# Patient Record
Sex: Male | Born: 1960 | Race: White | Hispanic: No | Marital: Married | Smoking: Never smoker
Health system: Southern US, Community
[De-identification: ages and names within clinical notes are randomized; demographics above are authoritative.]

## PROBLEM LIST (undated history)

## (undated) DIAGNOSIS — F1010 Alcohol abuse, uncomplicated: Secondary | ICD-10-CM

## (undated) DIAGNOSIS — I10 Essential (primary) hypertension: Secondary | ICD-10-CM

## (undated) DIAGNOSIS — S3992XA Unspecified injury of lower back, initial encounter: Secondary | ICD-10-CM

## (undated) DIAGNOSIS — R519 Headache, unspecified: Secondary | ICD-10-CM

## (undated) DIAGNOSIS — E785 Hyperlipidemia, unspecified: Secondary | ICD-10-CM

## (undated) DIAGNOSIS — R972 Elevated prostate specific antigen [PSA]: Secondary | ICD-10-CM

## (undated) DIAGNOSIS — W19XXXA Unspecified fall, initial encounter: Secondary | ICD-10-CM

## (undated) HISTORY — PX: TONSILLECTOMY: SUR1361

## (undated) HISTORY — DX: Hyperlipidemia, unspecified: E78.5

## (undated) HISTORY — DX: Essential (primary) hypertension: I10

## (undated) HISTORY — DX: Unspecified fall, initial encounter: W19.XXXA

## (undated) HISTORY — DX: Unspecified injury of lower back, initial encounter: S39.92XA

## (undated) HISTORY — DX: Headache, unspecified: R51.9

## (undated) HISTORY — DX: Alcohol abuse, uncomplicated: F10.10

## (undated) HISTORY — DX: Elevated prostate specific antigen (PSA): R97.20

---

## 2006-12-21 ENCOUNTER — Ambulatory Visit: Payer: Self-pay

## 2010-11-15 ENCOUNTER — Emergency Department: Payer: Self-pay | Admitting: Unknown Physician Specialty

## 2011-09-17 LAB — HM COLONOSCOPY

## 2014-11-10 ENCOUNTER — Emergency Department: Payer: Self-pay | Admitting: Internal Medicine

## 2015-02-20 ENCOUNTER — Other Ambulatory Visit: Payer: Self-pay | Admitting: Neurology

## 2015-02-20 DIAGNOSIS — M542 Cervicalgia: Secondary | ICD-10-CM

## 2015-02-28 ENCOUNTER — Ambulatory Visit
Admission: RE | Admit: 2015-02-28 | Discharge: 2015-02-28 | Disposition: A | Discharge status: Home / Self Care | Payer: 59 | Source: Ambulatory Visit | Attending: Neurology | Admitting: Neurology

## 2015-02-28 DIAGNOSIS — M542 Cervicalgia: Secondary | ICD-10-CM

## 2015-02-28 DIAGNOSIS — M4802 Spinal stenosis, cervical region: Secondary | ICD-10-CM | POA: Diagnosis not present

## 2015-04-18 ENCOUNTER — Other Ambulatory Visit: Payer: Self-pay | Admitting: Physical Medicine and Rehabilitation

## 2015-04-18 DIAGNOSIS — M5416 Radiculopathy, lumbar region: Secondary | ICD-10-CM

## 2015-04-26 ENCOUNTER — Ambulatory Visit
Admission: RE | Admit: 2015-04-26 | Discharge: 2015-04-26 | Disposition: A | Discharge status: Home / Self Care | Payer: 59 | Source: Ambulatory Visit | Attending: Physical Medicine and Rehabilitation | Admitting: Physical Medicine and Rehabilitation

## 2015-04-26 DIAGNOSIS — M5137 Other intervertebral disc degeneration, lumbosacral region: Secondary | ICD-10-CM | POA: Diagnosis not present

## 2015-04-26 DIAGNOSIS — M5416 Radiculopathy, lumbar region: Secondary | ICD-10-CM | POA: Diagnosis present

## 2015-07-31 ENCOUNTER — Other Ambulatory Visit: Payer: Self-pay | Admitting: Family Medicine

## 2015-08-01 NOTE — Telephone Encounter (Signed)
Will you please call for appointment so we can refill meds

## 2015-08-01 NOTE — Telephone Encounter (Signed)
Overdue for phyiscal in June with Dr. Dossie Arbourrissman. Needs an appointment for that. I'm sure Dr. Dossie Arbourrissman will get him enough medicine to make it to appointment when it's booked.

## 2015-08-05 NOTE — Telephone Encounter (Signed)
08/15/15 @ 10 °

## 2015-08-15 ENCOUNTER — Ambulatory Visit (INDEPENDENT_AMBULATORY_CARE_PROVIDER_SITE_OTHER): Payer: 59 | Admitting: Family Medicine

## 2015-08-15 ENCOUNTER — Encounter: Payer: Self-pay | Admitting: Family Medicine

## 2015-08-15 VITALS — BP 124/82 | HR 75 | Temp 97.9°F | Ht 73.4 in | Wt 229.2 lb

## 2015-08-15 DIAGNOSIS — Z0000 Encounter for general adult medical examination without abnormal findings: Secondary | ICD-10-CM | POA: Diagnosis not present

## 2015-08-15 DIAGNOSIS — Z23 Encounter for immunization: Secondary | ICD-10-CM | POA: Diagnosis not present

## 2015-08-15 DIAGNOSIS — E1169 Type 2 diabetes mellitus with other specified complication: Secondary | ICD-10-CM | POA: Insufficient documentation

## 2015-08-15 DIAGNOSIS — Z8639 Personal history of other endocrine, nutritional and metabolic disease: Secondary | ICD-10-CM | POA: Diagnosis not present

## 2015-08-15 DIAGNOSIS — I10 Essential (primary) hypertension: Secondary | ICD-10-CM | POA: Diagnosis not present

## 2015-08-15 LAB — URINALYSIS, ROUTINE W REFLEX MICROSCOPIC
Bilirubin, UA: NEGATIVE
Glucose, UA: NEGATIVE
Ketones, UA: NEGATIVE
Leukocytes, UA: NEGATIVE
NITRITE UA: NEGATIVE
PH UA: 7 (ref 5.0–7.5)
Protein, UA: NEGATIVE
RBC, UA: NEGATIVE
Specific Gravity, UA: 1.01 (ref 1.005–1.030)
UUROB: 0.2 mg/dL (ref 0.2–1.0)

## 2015-08-15 LAB — BAYER DCA HB A1C WAIVED: HB A1C: 6.5 % (ref <7.0)

## 2015-08-15 MED ORDER — BENAZEPRIL HCL 40 MG PO TABS
40.0000 mg | ORAL_TABLET | Freq: Every day | ORAL | Status: DC
Start: 2015-08-15 — End: 2016-09-18

## 2015-08-15 NOTE — Assessment & Plan Note (Signed)
We'll check hemoglobin A1c
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
## 2015-08-16 LAB — CBC WITH DIFFERENTIAL/PLATELET
BASOS: 0 %
Basophils Absolute: 0 10*3/uL (ref 0.0–0.2)
EOS (ABSOLUTE): 0.2 10*3/uL (ref 0.0–0.4)
EOS: 3 %
HEMATOCRIT: 45.8 % (ref 37.5–51.0)
Hemoglobin: 15.4 g/dL (ref 12.6–17.7)
IMMATURE GRANS (ABS): 0 10*3/uL (ref 0.0–0.1)
IMMATURE GRANULOCYTES: 0 %
LYMPHS: 28 %
Lymphocytes Absolute: 2.1 10*3/uL (ref 0.7–3.1)
MCH: 29.9 pg (ref 26.6–33.0)
MCHC: 33.6 g/dL (ref 31.5–35.7)
MCV: 89 fL (ref 79–97)
MONOCYTES: 8 %
Monocytes Absolute: 0.6 10*3/uL (ref 0.1–0.9)
NEUTROS ABS: 4.5 10*3/uL (ref 1.4–7.0)
NEUTROS PCT: 61 %
PLATELETS: 238 10*3/uL (ref 150–379)
RBC: 5.15 x10E6/uL (ref 4.14–5.80)
RDW: 14 % (ref 12.3–15.4)
WBC: 7.5 10*3/uL (ref 3.4–10.8)

## 2015-08-16 LAB — COMPREHENSIVE METABOLIC PANEL
A/G RATIO: 1.9 (ref 1.1–2.5)
ALBUMIN: 4.5 g/dL (ref 3.5–5.5)
ALT: 20 IU/L (ref 0–44)
AST: 16 IU/L (ref 0–40)
Alkaline Phosphatase: 98 IU/L (ref 39–117)
BILIRUBIN TOTAL: 0.3 mg/dL (ref 0.0–1.2)
BUN / CREAT RATIO: 20 (ref 9–20)
BUN: 20 mg/dL (ref 6–24)
CHLORIDE: 99 mmol/L (ref 97–106)
CO2: 24 mmol/L (ref 18–29)
Calcium: 9.7 mg/dL (ref 8.7–10.2)
Creatinine, Ser: 1.02 mg/dL (ref 0.76–1.27)
GFR calc non Af Amer: 82 mL/min/{1.73_m2} (ref >59)
GFR, EST AFRICAN AMERICAN: 95 mL/min/{1.73_m2} (ref >59)
Globulin, Total: 2.4 g/dL (ref 1.5–4.5)
Glucose: 115 mg/dL — ABNORMAL HIGH (ref 65–99)
Potassium: 4.6 mmol/L (ref 3.5–5.2)
Sodium: 139 mmol/L (ref 136–144)
TOTAL PROTEIN: 6.9 g/dL (ref 6.0–8.5)

## 2015-08-16 LAB — LIPID PANEL
Chol/HDL Ratio: 4.2 ratio units (ref 0.0–5.0)
Cholesterol, Total: 190 mg/dL (ref 100–199)
HDL: 45 mg/dL (ref >39)
LDL Calculated: 127 mg/dL — ABNORMAL HIGH (ref 0–99)
Triglycerides: 91 mg/dL (ref 0–149)
VLDL Cholesterol Cal: 18 mg/dL (ref 5–40)

## 2015-08-16 LAB — TSH: TSH: 2.5 u[IU]/mL (ref 0.450–4.500)

## 2015-08-16 LAB — PSA: Prostate Specific Ag, Serum: 1 ng/mL (ref 0.0–4.0)

## 2015-08-19 ENCOUNTER — Encounter: Payer: Self-pay | Admitting: Family Medicine

## 2015-09-24 ENCOUNTER — Telehealth: Payer: Self-pay | Admitting: Family Medicine
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
## 2015-10-08 ENCOUNTER — Encounter: Payer: Self-pay | Admitting: Family Medicine

## 2015-10-08 ENCOUNTER — Ambulatory Visit (INDEPENDENT_AMBULATORY_CARE_PROVIDER_SITE_OTHER): Payer: 59 | Admitting: Family Medicine

## 2015-10-08 VITALS — BP 133/77 | HR 91 | Temp 98.0°F | Ht 74.2 in | Wt 229.0 lb

## 2015-10-08 DIAGNOSIS — G8929 Other chronic pain: Secondary | ICD-10-CM | POA: Diagnosis not present

## 2015-10-08 DIAGNOSIS — M25511 Pain in right shoulder: Secondary | ICD-10-CM | POA: Diagnosis not present

## 2015-10-08 DIAGNOSIS — M25512 Pain in left shoulder: Secondary | ICD-10-CM

## 2015-10-08 DIAGNOSIS — M79603 Pain in arm, unspecified: Secondary | ICD-10-CM | POA: Insufficient documentation

## 2015-10-08 DIAGNOSIS — M5030 Other cervical disc degeneration, unspecified cervical region: Secondary | ICD-10-CM | POA: Diagnosis not present
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
## 2015-12-04 ENCOUNTER — Ambulatory Visit: Payer: 59 | Attending: Neurology | Admitting: Physical Therapy

## 2015-12-04 DIAGNOSIS — M542 Cervicalgia: Secondary | ICD-10-CM | POA: Diagnosis not present

## 2015-12-04 DIAGNOSIS — M2560 Stiffness of unspecified joint, not elsewhere classified: Secondary | ICD-10-CM | POA: Insufficient documentation

## 2015-12-05 ENCOUNTER — Encounter: Payer: Self-pay | Admitting: Physical Therapy
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
## 2015-12-09 NOTE — Addendum Note (Signed)
Addended by: Cammie Mcgee on: 12/09/2015 03:11 PM   Modules accepted: Orders

## 2015-12-26 ENCOUNTER — Encounter: Payer: Self-pay | Admitting: Family Medicine

## 2015-12-26 ENCOUNTER — Ambulatory Visit (INDEPENDENT_AMBULATORY_CARE_PROVIDER_SITE_OTHER): Payer: 59 | Admitting: Family Medicine

## 2015-12-26 VITALS — BP 138/86 | HR 86 | Temp 98.7°F | Ht 73.5 in | Wt 228.0 lb

## 2015-12-26 DIAGNOSIS — M5030 Other cervical disc degeneration, unspecified cervical region: Secondary | ICD-10-CM | POA: Diagnosis not present

## 2015-12-26 DIAGNOSIS — I10 Essential (primary) hypertension: Secondary | ICD-10-CM

## 2015-12-30 NOTE — Assessment & Plan Note (Signed)
The current medical regimen is effective;  continue present plan and medications.  

## 2015-12-30 NOTE — Assessment & Plan Note (Signed)
Ongoing chronic pain with limitations
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
## 2016-02-17 ENCOUNTER — Ambulatory Visit: Payer: 59 | Admitting: Family Medicine

## 2016-09-18 ENCOUNTER — Other Ambulatory Visit: Payer: Self-pay | Admitting: Family Medicine

## 2016-09-18 DIAGNOSIS — I10 Essential (primary) hypertension: Secondary | ICD-10-CM

## 2016-10-13 ENCOUNTER — Encounter: Payer: Self-pay | Admitting: Emergency Medicine

## 2016-10-13 ENCOUNTER — Ambulatory Visit (INDEPENDENT_AMBULATORY_CARE_PROVIDER_SITE_OTHER): Payer: 59

## 2016-10-13 ENCOUNTER — Ambulatory Visit
Admission: EM | Admit: 2016-10-13 | Discharge: 2016-10-13 | Disposition: A | Discharge status: Home / Self Care | Payer: 59 | Attending: Internal Medicine | Admitting: Internal Medicine

## 2016-10-13 DIAGNOSIS — T148XXA Other injury of unspecified body region, initial encounter: Secondary | ICD-10-CM

## 2016-10-13 DIAGNOSIS — W540XXA Bitten by dog, initial encounter: Secondary | ICD-10-CM

## 2016-10-13 DIAGNOSIS — S63501A Unspecified sprain of right wrist, initial encounter: Secondary | ICD-10-CM

## 2016-10-13 DIAGNOSIS — S63502A Unspecified sprain of left wrist, initial encounter: Secondary | ICD-10-CM | POA: Diagnosis not present

## 2016-10-13 MED ORDER — AMOXICILLIN-POT CLAVULANATE 875-125 MG PO TABS: 1.0000 | ORAL_TABLET | Freq: Two times a day (BID) | ORAL | 0 refills | Status: DC

## 2016-10-13 MED ORDER — MUPIROCIN 2 % EX OINT: 1.0000 "application " | TOPICAL_OINTMENT | Freq: Three times a day (TID) | CUTANEOUS | 0 refills | Status: DC
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
## 2016-10-13 NOTE — ED Triage Notes (Signed)
Patient states he fell while breaking up a fight between his dog and cat and injured his right wrist.

## 2017-11-10 ENCOUNTER — Encounter: Payer: Self-pay | Admitting: Family Medicine

## 2017-11-10 ENCOUNTER — Ambulatory Visit (INDEPENDENT_AMBULATORY_CARE_PROVIDER_SITE_OTHER): Payer: 59 | Admitting: Family Medicine

## 2017-11-10 VITALS — BP 138/84 | HR 73 | Temp 98.1°F | Ht 73.0 in | Wt 224.6 lb

## 2017-11-10 DIAGNOSIS — Z0000 Encounter for general adult medical examination without abnormal findings: Secondary | ICD-10-CM | POA: Diagnosis not present

## 2017-11-10 DIAGNOSIS — F322 Major depressive disorder, single episode, severe without psychotic features: Secondary | ICD-10-CM | POA: Insufficient documentation

## 2017-11-10 DIAGNOSIS — Z1329 Encounter for screening for other suspected endocrine disorder: Secondary | ICD-10-CM

## 2017-11-10 DIAGNOSIS — I10 Essential (primary) hypertension: Secondary | ICD-10-CM | POA: Diagnosis not present

## 2017-11-10 DIAGNOSIS — Z8639 Personal history of other endocrine, nutritional and metabolic disease: Secondary | ICD-10-CM

## 2017-11-10 DIAGNOSIS — Z125 Encounter for screening for malignant neoplasm of prostate: Secondary | ICD-10-CM

## 2017-11-10 LAB — URINALYSIS, ROUTINE W REFLEX MICROSCOPIC
BILIRUBIN UA: NEGATIVE
GLUCOSE, UA: NEGATIVE
Ketones, UA: NEGATIVE
Leukocytes, UA: NEGATIVE
Nitrite, UA: NEGATIVE
PH UA: 6.5 (ref 5.0–7.5)
PROTEIN UA: NEGATIVE
RBC UA: NEGATIVE
SPEC GRAV UA: 1.01 (ref 1.005–1.030)
Urobilinogen, Ur: 0.2 mg/dL (ref 0.2–1.0)

## 2017-11-10 LAB — BAYER DCA HB A1C WAIVED: HB A1C: 6.4 % (ref <7.0)

## 2017-11-10 MED ORDER — DULOXETINE HCL 60 MG PO CPEP: 60.0000 mg | ORAL_CAPSULE | Freq: Every day | ORAL | 1 refills | Status: DC

## 2017-11-10 MED ORDER — DULOXETINE HCL 30 MG PO CPEP: 30.0000 mg | ORAL_CAPSULE | Freq: Every day | ORAL | 0 refills | Status: DC

## 2017-11-10 MED ORDER — BENAZEPRIL HCL 40 MG PO TABS: 40.0000 mg | ORAL_TABLET | Freq: Every day | ORAL | 4 refills | Status: DC

## 2017-11-10 NOTE — Assessment & Plan Note (Signed)
A1c still borderline 6.4

## 2017-11-10 NOTE — Assessment & Plan Note (Signed)
The current medical regimen is effective;  continue present plan and medications.  

## 2017-11-10 NOTE — Assessment & Plan Note (Signed)
Discussed depression care and treatment aggravation of pain also in secondary to chronic stress will begin Cymbalta 30 mg 1 a day for a week then increase to Cymbalta 60 mg a day.  Recheck in 2-3 weeks
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
## 2017-11-11 LAB — CBC WITH DIFFERENTIAL/PLATELET
BASOS ABS: 0 10*3/uL (ref 0.0–0.2)
BASOS: 0 %
EOS (ABSOLUTE): 0 10*3/uL (ref 0.0–0.4)
EOS: 1 %
HEMATOCRIT: 42 % (ref 37.5–51.0)
HEMOGLOBIN: 14.6 g/dL (ref 13.0–17.7)
Immature Grans (Abs): 0 10*3/uL (ref 0.0–0.1)
Immature Granulocytes: 0 %
LYMPHS ABS: 2.2 10*3/uL (ref 0.7–3.1)
LYMPHS: 24 %
MCH: 30.5 pg (ref 26.6–33.0)
MCHC: 34.8 g/dL (ref 31.5–35.7)
MCV: 88 fL (ref 79–97)
MONOCYTES: 6 %
Monocytes Absolute: 0.5 10*3/uL (ref 0.1–0.9)
NEUTROS ABS: 6.1 10*3/uL (ref 1.4–7.0)
Neutrophils: 69 %
Platelets: 275 10*3/uL (ref 150–379)
RBC: 4.79 x10E6/uL (ref 4.14–5.80)
RDW: 14.1 % (ref 12.3–15.4)
WBC: 8.9 10*3/uL (ref 3.4–10.8)

## 2017-11-11 LAB — LIPID PANEL
CHOLESTEROL TOTAL: 192 mg/dL (ref 100–199)
Chol/HDL Ratio: 4.1 ratio (ref 0.0–5.0)
HDL: 47 mg/dL (ref >39)
LDL Calculated: 125 mg/dL — ABNORMAL HIGH (ref 0–99)
TRIGLYCERIDES: 99 mg/dL (ref 0–149)
VLDL CHOLESTEROL CAL: 20 mg/dL (ref 5–40)

## 2017-11-11 LAB — COMPREHENSIVE METABOLIC PANEL
A/G RATIO: 1.8 (ref 1.2–2.2)
ALT: 22 IU/L (ref 0–44)
AST: 17 IU/L (ref 0–40)
Albumin: 4.7 g/dL (ref 3.5–5.5)
Alkaline Phosphatase: 90 IU/L (ref 39–117)
BILIRUBIN TOTAL: 0.4 mg/dL (ref 0.0–1.2)
BUN/Creatinine Ratio: 17 (ref 9–20)
BUN: 17 mg/dL (ref 6–24)
CHLORIDE: 102 mmol/L (ref 96–106)
CO2: 22 mmol/L (ref 20–29)
Calcium: 9.8 mg/dL (ref 8.7–10.2)
Creatinine, Ser: 1.02 mg/dL (ref 0.76–1.27)
GFR calc Af Amer: 93 mL/min/{1.73_m2} (ref >59)
GFR calc non Af Amer: 81 mL/min/{1.73_m2} (ref >59)
GLUCOSE: 139 mg/dL — AB (ref 65–99)
Globulin, Total: 2.6 g/dL (ref 1.5–4.5)
POTASSIUM: 4.3 mmol/L (ref 3.5–5.2)
Sodium: 141 mmol/L (ref 134–144)
TOTAL PROTEIN: 7.3 g/dL (ref 6.0–8.5)

## 2017-11-11 LAB — TSH: TSH: 1.74 u[IU]/mL (ref 0.450–4.500)

## 2017-11-11 LAB — PSA: Prostate Specific Ag, Serum: 0.8 ng/mL (ref 0.0–4.0)

## 2017-11-24 ENCOUNTER — Telehealth: Payer: Self-pay | Admitting: Family Medicine

## 2017-11-24 NOTE — Telephone Encounter (Signed)
Please see message and advise thanks

## 2017-11-24 NOTE — Telephone Encounter (Signed)
Copied from CRM [id number]. Topic: Quick Communication - See Telephone Encounter >> Nov 24, 2017 12:00 PM Rudi CocoLathan, Farooq Petrovich M, NT wrote: CRM for notification. See Telephone encounter for:   11/24/17. John calling [facility] asking about medical request for pt. That was sent on November 15 2017. Jonny RuizJohn can be reached at [phone] (no portal ruquest)

## 2017-12-01 ENCOUNTER — Ambulatory Visit: Payer: 59 | Admitting: Family Medicine

## 2017-12-01 ENCOUNTER — Encounter: Payer: Self-pay | Admitting: Family Medicine

## 2017-12-01 DIAGNOSIS — M25512 Pain in left shoulder: Secondary | ICD-10-CM | POA: Diagnosis not present

## 2017-12-01 DIAGNOSIS — F322 Major depressive disorder, single episode, severe without psychotic features: Secondary | ICD-10-CM

## 2017-12-01 DIAGNOSIS — G8929 Other chronic pain: Secondary | ICD-10-CM

## 2017-12-01 DIAGNOSIS — I10 Essential (primary) hypertension: Secondary | ICD-10-CM | POA: Diagnosis not present

## 2017-12-01 DIAGNOSIS — M25511 Pain in right shoulder: Secondary | ICD-10-CM

## 2017-12-01 MED ORDER — DULOXETINE HCL 60 MG PO CPEP: 60.0000 mg | ORAL_CAPSULE | Freq: Every day | ORAL | 5 refills | Status: DC

## 2017-12-01 NOTE — Progress Notes (Signed)
BP 129/86   Pulse 88   Wt 216 lb (98 kg)   SpO2 98%   BMI 28.50 kg/m    Subjective:    Patient ID: Shawn Moore, male    DOB: 03/29/1961, 57 y.o.   MRN: [id number]  HPI: Shawn Moore is a 57 y.o. male  Chief Complaint  Patient presents with  . Follow-up  . Hypertension  Depression Pain  Patient doing remarkably better 80% improved depression pain nerves are improved Patient's decided to settle with insurance company after discussion with insurance shots in his neck has helped with pain great deal is sleeping better.  Blood pressure significantly improved.  Coworkers have been noticed a difference.  Relevant past medical, surgical, family and social history reviewed and updated as indicated. Interim medical history since our last visit reviewed. Allergies and medications reviewed and updated.  Review of Systems  Constitutional: Negative.   Respiratory: Negative.   Cardiovascular: Negative.     Per HPI unless specifically indicated above     Objective:    BP 129/86   Pulse 88   Wt 216 lb (98 kg)   SpO2 98%   BMI 28.50 kg/m   Wt Readings from Last 3 Encounters:  12/01/17 216 lb (98 kg)  11/10/17 224 lb 9.6 oz (101.9 kg)  10/13/16 229 lb (103.9 kg)    Physical Exam  Constitutional: He is oriented to person, place, and time. He appears well-developed and well-nourished.  HENT:  Head: Normocephalic and atraumatic.  Eyes: Conjunctivae and EOM are normal.  Neck: Normal range of motion.  Cardiovascular: Normal rate, regular rhythm and normal heart sounds.  Pulmonary/Chest: Effort normal and breath sounds normal.  Musculoskeletal: Normal range of motion.  Neurological: He is alert and oriented to person, place, and time.  Skin: No erythema.  Psychiatric: He has a normal mood and affect. His behavior is normal. Judgment and thought content normal.    Results for orders placed or performed in visit on 11/10/17  CBC with Differential/Platelet  Result Value Ref  Range   WBC 8.9 3.4 - 10.8 x10E3/uL   RBC 4.79 4.14 - 5.80 x10E6/uL   Hemoglobin 14.6 13.0 - 17.7 g/dL   Hematocrit 40.942.0 81.137.5 - 51.0 %   MCV 88 79 - 97 fL   MCH 30.5 26.6 - 33.0 pg   MCHC 34.8 31.5 - 35.7 g/dL   RDW 91.414.1 78.212.3 - 95.615.4 %   Platelets 275 150 - 379 x10E3/uL   Neutrophils 69 Not Estab. %   Lymphs 24 Not Estab. %   Monocytes 6 Not Estab. %   Eos 1 Not Estab. %   Basos 0 Not Estab. %   Neutrophils Absolute 6.1 1.4 - 7.0 x10E3/uL   Lymphocytes Absolute 2.2 0.7 - 3.1 x10E3/uL   Monocytes Absolute 0.5 0.1 - 0.9 x10E3/uL   EOS (ABSOLUTE) 0.0 0.0 - 0.4 x10E3/uL   Basophils Absolute 0.0 0.0 - 0.2 x10E3/uL   Immature Granulocytes 0 Not Estab. %   Immature Grans (Abs) 0.0 0.0 - 0.1 x10E3/uL  Comprehensive metabolic panel  Result Value Ref Range   Glucose 139 (H) 65 - 99 mg/dL   BUN 17 6 - 24 mg/dL   Creatinine, Ser 2.131.02 0.76 - 1.27 mg/dL   GFR calc non Af Amer 81 >59 mL/min/1.73   GFR calc Af Amer 93 >59 mL/min/1.73   BUN/Creatinine Ratio 17 9 - 20   Sodium 141 134 - 144 mmol/L   Potassium 4.3 3.5 - 5.2  mmol/L   Chloride 102 96 - 106 mmol/L   CO2 22 20 - 29 mmol/L   Calcium 9.8 8.7 - 10.2 mg/dL   Total Protein 7.3 6.0 - 8.5 g/dL   Albumin 4.7 3.5 - 5.5 g/dL   Globulin, Total 2.6 1.5 - 4.5 g/dL   Albumin/Globulin Ratio 1.8 1.2 - 2.2   Bilirubin Total 0.4 0.0 - 1.2 mg/dL   Alkaline Phosphatase 90 39 - 117 IU/L   AST 17 0 - 40 IU/L   ALT 22 0 - 44 IU/L  Lipid panel  Result Value Ref Range   Cholesterol, Total 192 100 - 199 mg/dL   Triglycerides 99 0 - 149 mg/dL   HDL 47 >16 mg/dL   VLDL Cholesterol Cal 20 5 - 40 mg/dL   LDL Calculated 109 (H) 0 - 99 mg/dL   Chol/HDL Ratio 4.1 0.0 - 5.0 ratio  PSA  Result Value Ref Range   Prostate Specific Ag, Serum 0.8 0.0 - 4.0 ng/mL  TSH  Result Value Ref Range   TSH 1.740 0.450 - 4.500 uIU/mL  Urinalysis, Routine w reflex microscopic  Result Value Ref Range   Specific Gravity, UA 1.010 1.005 - 1.030   pH, UA 6.5 5.0 - 7.5    Color, UA Yellow Yellow   Appearance Ur Clear Clear   Leukocytes, UA Negative Negative   Protein, UA Negative Negative/Trace   Glucose, UA Negative Negative   Ketones, UA Negative Negative   RBC, UA Negative Negative   Bilirubin, UA Negative Negative   Urobilinogen, Ur 0.2 0.2 - 1.0 mg/dL   Nitrite, UA Negative Negative  Bayer DCA Hb A1c Waived  Result Value Ref Range   Bayer DCA Hb A1c Waived 6.4 <7.0 %      Assessment & Plan:   Problem List Items Addressed This Visit      Cardiovascular and Mediastinum   Essential hypertension    The current medical regimen is effective;  continue present plan and medications.         Other   Chronic pain of both shoulders    Significantly improved with both medicine and injection from pain clinic      Depression, major, single episode, severe (HCC)    Significantly improved discussed duration of treatment continue recheck in 2-3 months.      Relevant Medications   DULoxetine (CYMBALTA) 60 MG capsule       Follow up plan: Return in about 2 months (around 01/29/2018), or if symptoms worsen or fail to improve, for Depression and blood pressure check.

## 2017-12-01 NOTE — Assessment & Plan Note (Signed)
Significantly improved with both medicine and injection from pain clinic

## 2017-12-01 NOTE — Assessment & Plan Note (Signed)
The current medical regimen is effective;  continue present plan and medications.  

## 2017-12-01 NOTE — Assessment & Plan Note (Signed)
Significantly improved discussed duration of treatment continue recheck in 2-3 months.

## 2019-01-30 ENCOUNTER — Encounter: Payer: 59 | Admitting: Family Medicine

## 2019-02-01 ENCOUNTER — Encounter: Payer: 59 | Admitting: Family Medicine

## 2019-02-07 ENCOUNTER — Ambulatory Visit (INDEPENDENT_AMBULATORY_CARE_PROVIDER_SITE_OTHER): Payer: 59 | Admitting: Family Medicine

## 2019-02-07 ENCOUNTER — Ambulatory Visit: Payer: Self-pay

## 2019-02-07 ENCOUNTER — Other Ambulatory Visit: Payer: Self-pay

## 2019-02-07 ENCOUNTER — Encounter: Payer: Self-pay | Admitting: Family Medicine

## 2019-02-07 DIAGNOSIS — F322 Major depressive disorder, single episode, severe without psychotic features: Secondary | ICD-10-CM | POA: Diagnosis not present

## 2019-02-07 DIAGNOSIS — I10 Essential (primary) hypertension: Secondary | ICD-10-CM | POA: Diagnosis not present

## 2019-02-07 DIAGNOSIS — M79602 Pain in left arm: Secondary | ICD-10-CM | POA: Diagnosis not present

## 2019-02-07 MED ORDER — DULOXETINE HCL 60 MG PO CPEP: 60.0000 mg | ORAL_CAPSULE | Freq: Every day | ORAL | 2 refills | Status: DC

## 2019-02-07 MED ORDER — BENAZEPRIL HCL 40 MG PO TABS: 40.0000 mg | ORAL_TABLET | Freq: Every day | ORAL | 4 refills | Status: DC

## 2019-02-07 NOTE — Telephone Encounter (Signed)
appt scheduled with [facility] 4/22 @  9

## 2019-02-07 NOTE — Assessment & Plan Note (Signed)
Discuss hypertension poor control but with adjusting his blood pressure cuff good control will observe blood pressure and see what his readings really are.
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
## 2019-02-07 NOTE — Assessment & Plan Note (Signed)
The current medical regimen is effective;  continue present plan and medications.  

## 2019-02-07 NOTE — Progress Notes (Signed)
BP (!) 165/95    Subjective:    Patient ID: Shawn Moore, male    DOB: Sep 09, 1961, 58 y.o.   MRN: [id number]  HPI: Shawn Moore is a 58 y.o. male  Lt arm pain BP elevation  Telemedicine using audio/video telecommunications for a synchronous communication visit. Today's visit due to COVID-19 isolation precautions I connected with and verified that I am speaking with the correct person using two identifiers.   I discussed the limitations, risks, security and privacy concerns of performing an evaluation and management service by telecommunication and the availability of in person appointments. I also discussed with the patient that there may be a patient responsible charge related to this service. The patient expressed understanding and agreed to proceed. The patient's location is I am at home.  Discussed with patient concern about some left arm pain that is pretty consistent not associated with exertion no nausea vomiting diaphoresis or exacerbation from exertion.  Seems to be pretty consistent.  Patient is especially worried because this, all associated with noticed blood pressure elevation.  Also seems to be associated with some slurring of speech and word finding issues associated with a late meal or extra work outside coupled with being hungry and eating a later.  This seems to resolve after eating and rest.  Relevant past medical, surgical, family and social history reviewed and updated as indicated. Interim medical history since our last visit reviewed. Allergies and medications reviewed and updated.  Review of Systems  Constitutional: Negative for chills, diaphoresis, fatigue and fever.  Respiratory: Negative for cough and shortness of breath.   Cardiovascular: Negative for chest pain, palpitations and leg swelling.  Neurological: Positive for syncope. Negative for tremors, weakness and headaches.  Psychiatric/Behavioral: Negative for behavioral problems and confusion.     Per HPI unless specifically indicated above     Objective:    BP (!) 165/95   Wt Readings from Last 3 Encounters:  12/01/17 216 lb (98 kg)  11/10/17 224 lb 9.6 oz (101.9 kg)  10/13/16 229 lb (103.9 kg)    Physical Exam  Results for orders placed or performed in visit on 11/10/17  CBC with Differential/Platelet  Result Value Ref Range   WBC 8.9 3.4 - 10.8 x10E3/uL   RBC 4.79 4.14 - 5.80 x10E6/uL   Hemoglobin 14.6 13.0 - 17.7 g/dL   Hematocrit 78.2 95.6 - 51.0 %   MCV 88 79 - 97 fL   MCH 30.5 26.6 - 33.0 pg   MCHC 34.8 31.5 - 35.7 g/dL   RDW 21.3 08.6 - 57.8 %   Platelets 275 150 - 379 x10E3/uL   Neutrophils 69 Not Estab. %   Lymphs 24 Not Estab. %   Monocytes 6 Not Estab. %   Eos 1 Not Estab. %   Basos 0 Not Estab. %   Neutrophils Absolute 6.1 1.4 - 7.0 x10E3/uL   Lymphocytes Absolute 2.2 0.7 - 3.1 x10E3/uL   Monocytes Absolute 0.5 0.1 - 0.9 x10E3/uL   EOS (ABSOLUTE) 0.0 0.0 - 0.4 x10E3/uL   Basophils Absolute 0.0 0.0 - 0.2 x10E3/uL   Immature Granulocytes 0 Not Estab. %   Immature Grans (Abs) 0.0 0.0 - 0.1 x10E3/uL  Comprehensive metabolic panel  Result Value Ref Range   Glucose 139 (H) 65 - 99 mg/dL   BUN 17 6 - 24 mg/dL   Creatinine, Ser 4.69 0.76 - 1.27 mg/dL   GFR calc non Af Amer 81 >59 mL/min/1.73   GFR calc Af Denyse Dago  93 >59 mL/min/1.73   BUN/Creatinine Ratio 17 9 - 20   Sodium 141 134 - 144 mmol/L   Potassium 4.3 3.5 - 5.2 mmol/L   Chloride 102 96 - 106 mmol/L   CO2 22 20 - 29 mmol/L   Calcium 9.8 8.7 - 10.2 mg/dL   Total Protein 7.3 6.0 - 8.5 g/dL   Albumin 4.7 3.5 - 5.5 g/dL   Globulin, Total 2.6 1.5 - 4.5 g/dL   Albumin/Globulin Ratio 1.8 1.2 - 2.2   Bilirubin Total 0.4 0.0 - 1.2 mg/dL   Alkaline Phosphatase 90 39 - 117 IU/L   AST 17 0 - 40 IU/L   ALT 22 0 - 44 IU/L  Lipid panel  Result Value Ref Range   Cholesterol, Total 192 100 - 199 mg/dL   Triglycerides 99 0 - 149 mg/dL   HDL 47 >16>39 mg/dL   VLDL Cholesterol Cal 20 5 - 40 mg/dL   LDL  Calculated 109125 (H) 0 - 99 mg/dL   Chol/HDL Ratio 4.1 0.0 - 5.0 ratio  PSA  Result Value Ref Range   Prostate Specific Ag, Serum 0.8 0.0 - 4.0 ng/mL  TSH  Result Value Ref Range   TSH 1.740 0.450 - 4.500 uIU/mL  Urinalysis, Routine w reflex microscopic  Result Value Ref Range   Specific Gravity, UA 1.010 1.005 - 1.030   pH, UA 6.5 5.0 - 7.5   Color, UA Yellow Yellow   Appearance Ur Clear Clear   Leukocytes, UA Negative Negative   Protein, UA Negative Negative/Trace   Glucose, UA Negative Negative   Ketones, UA Negative Negative   RBC, UA Negative Negative   Bilirubin, UA Negative Negative   Urobilinogen, Ur 0.2 0.2 - 1.0 mg/dL   Nitrite, UA Negative Negative  Bayer DCA Hb A1c Waived  Result Value Ref Range   HB A1C (BAYER DCA - WAIVED) 6.4 <7.0 %      Assessment & Plan:   Problem List Items Addressed This Visit      Cardiovascular and Mediastinum   Essential hypertension    Discuss hypertension poor control but with adjusting his blood pressure cuff good control will observe blood pressure and see what his readings really are.      Relevant Medications   benazepril (LOTENSIN) 40 MG tablet   Other Relevant Orders   EKG 12-Lead   Comprehensive metabolic panel   Lipid panel   CBC with Differential/Platelet   TSH   PSA   Urinalysis, Routine w reflex microscopic   Bayer DCA Hb A1c Waived     Other   Arm pain    Discussed arm pain and possible coronary artery disease associated with slurring of his speech because of this ongoing nature and new onset we will set up for an appointment at our office to further evaluate along with blood work.      Relevant Orders   EKG 12-Lead   Comprehensive metabolic panel   Lipid panel   CBC with Differential/Platelet   TSH   PSA   Urinalysis, Routine w reflex microscopic   Bayer DCA Hb A1c Waived   Depression, major, single episode, severe (HCC)    The current medical regimen is effective;  continue present plan and  medications.       Relevant Medications   DULoxetine (CYMBALTA) 60 MG capsule   Other Relevant Orders   EKG 12-Lead   Comprehensive metabolic panel   Lipid panel   CBC with Differential/Platelet   TSH  PSA   Urinalysis, Routine w reflex microscopic   Bayer DCA Hb A1c Waived     I discussed the assessment and treatment plan with the patient. The patient was provided an opportunity to ask questions and all were answered. The patient agreed with the plan and demonstrated an understanding of the instructions.   The patient was advised to call back or seek an in-person evaluation if the symptoms worsen or if the condition fails to improve as anticipated.   I provided 21+ minutes of time during this encounter.  Follow up plan: Return for this week for OV.

## 2019-02-07 NOTE — Assessment & Plan Note (Signed)
Discussed arm pain and possible coronary artery disease associated with slurring of his speech because of this ongoing nature and new onset we will set up for an appointment at our office to further evaluate along with blood work.

## 2019-02-08 ENCOUNTER — Other Ambulatory Visit: Payer: Self-pay

## 2019-02-08 ENCOUNTER — Encounter: Payer: Self-pay | Admitting: Family Medicine

## 2019-02-08 ENCOUNTER — Ambulatory Visit (INDEPENDENT_AMBULATORY_CARE_PROVIDER_SITE_OTHER): Payer: 59 | Admitting: Family Medicine

## 2019-02-08 VITALS — BP 129/77 | HR 79 | Temp 97.9°F | Ht 74.0 in | Wt 223.0 lb

## 2019-02-08 DIAGNOSIS — F322 Major depressive disorder, single episode, severe without psychotic features: Secondary | ICD-10-CM

## 2019-02-08 DIAGNOSIS — I10 Essential (primary) hypertension: Secondary | ICD-10-CM

## 2019-02-08 DIAGNOSIS — Z1159 Encounter for screening for other viral diseases: Secondary | ICD-10-CM

## 2019-02-08 DIAGNOSIS — E7800 Pure hypercholesterolemia, unspecified: Secondary | ICD-10-CM

## 2019-02-08 DIAGNOSIS — R42 Dizziness and giddiness: Secondary | ICD-10-CM

## 2019-02-08 DIAGNOSIS — R4781 Slurred speech: Secondary | ICD-10-CM | POA: Diagnosis not present

## 2019-02-08 DIAGNOSIS — E1169 Type 2 diabetes mellitus with other specified complication: Secondary | ICD-10-CM | POA: Insufficient documentation

## 2019-02-08 DIAGNOSIS — M79602 Pain in left arm: Secondary | ICD-10-CM

## 2019-02-08 DIAGNOSIS — R7301 Impaired fasting glucose: Secondary | ICD-10-CM

## 2019-02-08 DIAGNOSIS — E785 Hyperlipidemia, unspecified: Secondary | ICD-10-CM | POA: Insufficient documentation

## 2019-02-08 LAB — UA/M W/RFLX CULTURE, ROUTINE
Bilirubin, UA: NEGATIVE
Ketones, UA: NEGATIVE
Leukocytes,UA: NEGATIVE
Nitrite, UA: NEGATIVE
Protein,UA: NEGATIVE
RBC, UA: NEGATIVE
Specific Gravity, UA: 1.01 (ref 1.005–1.030)
Urobilinogen, Ur: 0.2 mg/dL (ref 0.2–1.0)
pH, UA: 5.5 (ref 5.0–7.5)

## 2019-02-08 MED ORDER — METOPROLOL SUCCINATE ER 25 MG PO TB24: 25.0000 mg | ORAL_TABLET | Freq: Every day | ORAL | 0 refills | Status: DC

## 2019-02-08 MED ORDER — VENLAFAXINE HCL ER 37.5 MG PO CP24: 37.5000 mg | ORAL_CAPSULE | Freq: Every day | ORAL | 0 refills | Status: DC
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
## 2019-02-09 ENCOUNTER — Encounter: Payer: Self-pay | Admitting: Family Medicine

## 2019-02-09 LAB — COMPREHENSIVE METABOLIC PANEL
ALT: 34 IU/L (ref 0–44)
AST: 18 IU/L (ref 0–40)
Albumin/Globulin Ratio: 2.5 — ABNORMAL HIGH (ref 1.2–2.2)
Albumin: 4.7 g/dL (ref 3.8–4.9)
Alkaline Phosphatase: 123 IU/L — ABNORMAL HIGH (ref 39–117)
BUN/Creatinine Ratio: 17 (ref 10–24)
BUN: 17 mg/dL (ref 8–27)
Bilirubin Total: 0.5 mg/dL (ref 0.0–1.2)
CO2: 25 mmol/L (ref 20–29)
Calcium: 9.7 mg/dL (ref 8.6–10.2)
Chloride: 99 mmol/L (ref 96–106)
Creatinine, Ser: 0.98 mg/dL (ref 0.76–1.27)
GFR calc Af Amer: 96 mL/min/{1.73_m2} (ref >59)
GFR calc non Af Amer: 83 mL/min/{1.73_m2} (ref >59)
Globulin, Total: 1.9 g/dL (ref 1.5–4.5)
Glucose: 204 mg/dL — ABNORMAL HIGH (ref 65–99)
Potassium: 4.6 mmol/L (ref 3.5–5.2)
Sodium: 137 mmol/L (ref 134–144)
Total Protein: 6.6 g/dL (ref 6.0–8.5)

## 2019-02-09 LAB — CBC WITH DIFFERENTIAL/PLATELET
Basophils Absolute: 0 10*3/uL (ref 0.0–0.2)
Basos: 1 %
EOS (ABSOLUTE): 0.2 10*3/uL (ref 0.0–0.4)
Eos: 4 %
Hematocrit: 47.2 % (ref 37.5–51.0)
Hemoglobin: 15.8 g/dL (ref 13.0–17.7)
Immature Grans (Abs): 0 10*3/uL (ref 0.0–0.1)
Immature Granulocytes: 0 %
Lymphocytes Absolute: 1.9 10*3/uL (ref 0.7–3.1)
Lymphs: 33 %
MCH: 29.8 pg (ref 26.6–33.0)
MCHC: 33.5 g/dL (ref 31.5–35.7)
MCV: 89 fL (ref 79–97)
Monocytes Absolute: 0.5 10*3/uL (ref 0.1–0.9)
Monocytes: 10 %
Neutrophils Absolute: 3 10*3/uL (ref 1.4–7.0)
Neutrophils: 52 %
Platelets: 233 10*3/uL (ref 150–450)
RBC: 5.3 x10E6/uL (ref 4.14–5.80)
RDW: 14.3 % (ref 11.6–15.4)
WBC: 5.7 10*3/uL (ref 3.4–10.8)

## 2019-02-09 LAB — HEMOGLOBIN A1C
Est. average glucose Bld gHb Est-mCnc: 237 mg/dL
Hgb A1c MFr Bld: 9.9 % — ABNORMAL HIGH (ref 4.8–5.6)

## 2019-02-09 LAB — PSA: Prostate Specific Ag, Serum: 0.9 ng/mL (ref 0.0–4.0)

## 2019-02-09 LAB — HEPATITIS C ANTIBODY: Hep C Virus Ab: <0.1 s/co ratio (ref 0.0–0.9)

## 2019-02-09 LAB — LIPID PANEL W/O CHOL/HDL RATIO
Cholesterol, Total: 194 mg/dL (ref 100–199)
HDL: 43 mg/dL (ref >39)
LDL Calculated: 125 mg/dL — ABNORMAL HIGH (ref 0–99)
Triglycerides: 132 mg/dL (ref 0–149)
VLDL Cholesterol Cal: 26 mg/dL (ref 5–40)

## 2019-02-09 LAB — TSH: TSH: 3.22 u[IU]/mL (ref 0.450–4.500)

## 2019-02-10 ENCOUNTER — Telehealth: Payer: Self-pay | Admitting: Family Medicine

## 2019-02-10 NOTE — Telephone Encounter (Signed)
Spoke with patient about results, significantly worsening A1C at 9.9 and continued mildly elevated LDL. Otherwise normal. Recommended in addition to lifestyle modifications we start a low dose of lipitor and metformin and recheck in 3 months. Pt wanting to hold off for a few weeks while he applies for Long Term Care insurance and will let us know after it's submitted and we can proceed with medication and adding diagnoses at that time

## 2019-02-10 NOTE — Telephone Encounter (Signed)
Called pt to discuss lab results, left VM to return call. Please let me know when he calls back

## 2019-02-10 NOTE — Telephone Encounter (Signed)
Rachel spoke with patient

## 2019-02-12 NOTE — Assessment & Plan Note (Signed)
Stretches, heat, OTC pain relievers. F/u with Neuro to continue working on long-term solutions for her radicular pain sxs

## 2019-02-12 NOTE — Assessment & Plan Note (Addendum)
Patient agreeable to trying effexor. Continue to monitor for benefit. Consider counseling

## 2019-02-12 NOTE — Assessment & Plan Note (Signed)
Add metoprolol 25 mg, continue benazepril 40 mg. Referring to Cardiology d/t some abnormal EKG findings and patient's concern over his sxs/desire for further evaluation and testing. Recheck in 2 weeks at scheduled PCP f/u, call with persistent abnormal readings in meantime

## 2019-02-12 NOTE — Assessment & Plan Note (Signed)
Recheck lipids, adjust as needed 

## 2019-02-22 ENCOUNTER — Ambulatory Visit (INDEPENDENT_AMBULATORY_CARE_PROVIDER_SITE_OTHER): Payer: 59 | Admitting: Family Medicine

## 2019-02-22 ENCOUNTER — Encounter: Payer: Self-pay | Admitting: Family Medicine

## 2019-02-22 ENCOUNTER — Other Ambulatory Visit: Payer: Self-pay

## 2019-02-22 DIAGNOSIS — F322 Major depressive disorder, single episode, severe without psychotic features: Secondary | ICD-10-CM | POA: Diagnosis not present

## 2019-02-22 DIAGNOSIS — E1169 Type 2 diabetes mellitus with other specified complication: Secondary | ICD-10-CM | POA: Diagnosis not present

## 2019-02-22 DIAGNOSIS — I10 Essential (primary) hypertension: Secondary | ICD-10-CM

## 2019-02-22 MED ORDER — METFORMIN HCL 500 MG PO TABS: 1000.0000 mg | ORAL_TABLET | Freq: Two times a day (BID) | ORAL | 5 refills | Status: DC

## 2019-02-22 NOTE — Progress Notes (Signed)
There were no vitals taken for this visit.   Subjective:    Patient ID: Shawn Moore, male    DOB: 06/06/1961, 58 y.o.   MRN: [id number]  HPI: Shawn Moore is a 58 y.o. male  Med check  Telemedicine using audio/video telecommunications for a synchronous communication visit. Today's visit due to COVID-19 isolation precautions I connected with and verified that I am speaking with the correct person using two identifiers.   I discussed the limitations, risks, security and privacy concerns of performing an evaluation and management service by telecommunication and the availability of in person appointments. I also discussed with the patient that there may be a patient responsible charge related to this service. The patient expressed understanding and agreed to proceed. The patient's location is home. Wife also helped with hx I am at home.  Discussed with patient and wife Effexor.  Patient's depression appears to be stable and Effexor low dose has not helped his neck at all.  Wants to discontinue medication and concurred. Discussed metoprolol for cardiac protection and heart blood pressure patient's blood pressure readings been good is interested in stopping this medication all reviewed cardiology cardiac echo was negative. Patient will discuss stopping metoprolol with cardiology but will probably stop. Discussed elevated hemoglobin A1c and patient's symptoms.  Discussed he has had 8 pound weight loss and has been exercising on a regular basis which is seem to help but still has widely varying home glucose readings.  Readings up to 200 and down to 130. Discussed metformin  Relevant past medical, surgical, family and social history reviewed and updated as indicated. Interim medical history since our last visit reviewed. Allergies and medications reviewed and updated.  Review of Systems  Constitutional: Negative.   Respiratory: Negative.   Cardiovascular: Negative.     Per HPI unless  specifically indicated above     Objective:    There were no vitals taken for this visit.  Wt Readings from Last 3 Encounters:  02/08/19 223 lb (101.2 kg)  12/01/17 216 lb (98 kg)  11/10/17 224 lb 9.6 oz (101.9 kg)    Physical Exam  Results for orders placed or performed in visit on 02/08/19  Hepatitis C antibody  Result Value Ref Range   Hep C Virus Ab <0.1 0.0 - 0.9 s/co ratio  CBC with Differential/Platelet  Result Value Ref Range   WBC 5.7 3.4 - 10.8 x10E3/uL   RBC 5.30 4.14 - 5.80 x10E6/uL   Hemoglobin 15.8 13.0 - 17.7 g/dL   Hematocrit 40.947.2 81.137.5 - 51.0 %   MCV 89 79 - 97 fL   MCH 29.8 26.6 - 33.0 pg   MCHC 33.5 31.5 - 35.7 g/dL   RDW 91.414.3 78.211.6 - 95.615.4 %   Platelets 233 150 - 450 x10E3/uL   Neutrophils 52 Not Estab. %   Lymphs 33 Not Estab. %   Monocytes 10 Not Estab. %   Eos 4 Not Estab. %   Basos 1 Not Estab. %   Neutrophils Absolute 3.0 1.4 - 7.0 x10E3/uL   Lymphocytes Absolute 1.9 0.7 - 3.1 x10E3/uL   Monocytes Absolute 0.5 0.1 - 0.9 x10E3/uL   EOS (ABSOLUTE) 0.2 0.0 - 0.4 x10E3/uL   Basophils Absolute 0.0 0.0 - 0.2 x10E3/uL   Immature Granulocytes 0 Not Estab. %   Immature Grans (Abs) 0.0 0.0 - 0.1 x10E3/uL  Comprehensive metabolic panel  Result Value Ref Range   Glucose 204 (H) 65 - 99 mg/dL   BUN 17 8 -  27 mg/dL   Creatinine, Ser 3.74 0.76 - 1.27 mg/dL   GFR calc non Af Amer 83 >59 mL/min/1.73   GFR calc Af Amer 96 >59 mL/min/1.73   BUN/Creatinine Ratio 17 10 - 24   Sodium 137 134 - 144 mmol/L   Potassium 4.6 3.5 - 5.2 mmol/L   Chloride 99 96 - 106 mmol/L   CO2 25 20 - 29 mmol/L   Calcium 9.7 8.6 - 10.2 mg/dL   Total Protein 6.6 6.0 - 8.5 g/dL   Albumin 4.7 3.8 - 4.9 g/dL   Globulin, Total 1.9 1.5 - 4.5 g/dL   Albumin/Globulin Ratio 2.5 (H) 1.2 - 2.2   Bilirubin Total 0.5 0.0 - 1.2 mg/dL   Alkaline Phosphatase 123 (H) 39 - 117 IU/L   AST 18 0 - 40 IU/L   ALT 34 0 - 44 IU/L  TSH  Result Value Ref Range   TSH 3.220 0.450 - 4.500 uIU/mL  UA/M  w/rflx Culture, Routine  Result Value Ref Range   Specific Gravity, UA 1.010 1.005 - 1.030   pH, UA 5.5 5.0 - 7.5   Color, UA Yellow Yellow   Appearance Ur Clear Clear   Leukocytes,UA Negative Negative   Protein,UA Negative Negative/Trace   Glucose, UA Trace (A) Negative   Ketones, UA Negative Negative   RBC, UA Negative Negative   Bilirubin, UA Negative Negative   Urobilinogen, Ur 0.2 0.2 - 1.0 mg/dL   Nitrite, UA Negative Negative  HgB A1c  Result Value Ref Range   Hgb A1c MFr Bld 9.9 (H) 4.8 - 5.6 %   Est. average glucose Bld gHb Est-mCnc 237 mg/dL  Lipid Panel w/o Chol/HDL Ratio  Result Value Ref Range   Cholesterol, Total 194 100 - 199 mg/dL   Triglycerides 827 0 - 149 mg/dL   HDL 43 >07 mg/dL   VLDL Cholesterol Cal 26 5 - 40 mg/dL   LDL Calculated 867 (H) 0 - 99 mg/dL  PSA  Result Value Ref Range   Prostate Specific Ag, Serum 0.9 0.0 - 4.0 ng/mL      Assessment & Plan:   Problem List Items Addressed This Visit      Cardiovascular and Mediastinum   Essential hypertension    The current medical regimen is effective;  continue present plan and medications.         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  Will observe what role elevated glucose has by treating his glucose.       I discussed the assessment and treatment plan with the patient. The patient was provided an opportunity to ask questions and all were answered. The patient agreed with the plan and demonstrated an understanding of the instructions.   The patient was advised to call back or seek an in-person evaluation if the symptoms worsen or if the condition fails to improve as  anticipated.   I provided 21+ minutes of time during this encounter.   Follow up plan: Return in about 2 months (around 04/24/2019) for Hemoglobin A1c.

## 2019-02-22 NOTE — Assessment & Plan Note (Signed)
Stable for now stopping Effexor.  Will observe what role elevated glucose has by treating his glucose.

## 2019-02-22 NOTE — Assessment & Plan Note (Signed)
The current medical regimen is effective;  continue present plan and medications.  

## 2019-02-22 NOTE — Assessment & Plan Note (Signed)
Discussed hemoglobin A1c elevated and diabetes will start metformin 500 mg at bedtime increasing 500 mg weekly to 1000 twice daily

## 2019-03-21 ENCOUNTER — Telehealth: Payer: Self-pay | Admitting: Family Medicine

## 2019-03-21 MED ORDER — METOPROLOL SUCCINATE ER 25 MG PO TB24: 25.0000 mg | ORAL_TABLET | Freq: Every day | ORAL | 3 refills | Status: DC

## 2019-03-21 NOTE — Telephone Encounter (Signed)
Copied from CRM [id number]. Topic: Quick Communication - Rx Refill/Question >> Mar 21, 2019 10:27 AM Doreatha Massed wrote: Medication: metoprolol succinate (TOPROL-XL) 25 MG 24 hr tablet  Has the patient contacted their pharmacy? no (Agent: If no, request that the patient contact the pharmacy for the refill.) pt need prior auth (Agent: If yes, when and what did the pharmacy advise?)  Preferred Pharmacy (with phone number or street name): [facility]/[address]  Agent: Please be advised that RX refills may take up to 3 business days. We ask that you follow-up with your pharmacy.

## 2019-05-11 ENCOUNTER — Other Ambulatory Visit: Payer: Self-pay

## 2019-05-11 ENCOUNTER — Emergency Department
Admission: EM | Admit: 2019-05-11 | Discharge: 2019-05-11 | Disposition: A | Discharge status: Home / Self Care | Payer: 59 | Attending: Emergency Medicine | Admitting: Emergency Medicine

## 2019-05-11 ENCOUNTER — Encounter: Payer: Self-pay | Admitting: Nurse Practitioner

## 2019-05-11 ENCOUNTER — Encounter: Payer: Self-pay | Admitting: Emergency Medicine

## 2019-05-11 ENCOUNTER — Ambulatory Visit: Payer: Self-pay | Admitting: Family Medicine

## 2019-05-11 ENCOUNTER — Emergency Department: Payer: 59

## 2019-05-11 ENCOUNTER — Ambulatory Visit (INDEPENDENT_AMBULATORY_CARE_PROVIDER_SITE_OTHER): Payer: Managed Care, Other (non HMO) | Admitting: Nurse Practitioner

## 2019-05-11 DIAGNOSIS — Z20828 Contact with and (suspected) exposure to other viral communicable diseases: Secondary | ICD-10-CM | POA: Insufficient documentation

## 2019-05-11 DIAGNOSIS — R509 Fever, unspecified: Secondary | ICD-10-CM | POA: Diagnosis present

## 2019-05-11 DIAGNOSIS — I10 Essential (primary) hypertension: Secondary | ICD-10-CM | POA: Insufficient documentation

## 2019-05-11 DIAGNOSIS — M25462 Effusion, left knee: Secondary | ICD-10-CM | POA: Diagnosis not present

## 2019-05-11 DIAGNOSIS — M79605 Pain in left leg: Secondary | ICD-10-CM | POA: Insufficient documentation

## 2019-05-11 DIAGNOSIS — M25562 Pain in left knee: Secondary | ICD-10-CM | POA: Diagnosis not present

## 2019-05-11 DIAGNOSIS — Z7984 Long term (current) use of oral hypoglycemic drugs: Secondary | ICD-10-CM | POA: Insufficient documentation

## 2019-05-11 DIAGNOSIS — L03116 Cellulitis of left lower limb: Secondary | ICD-10-CM | POA: Insufficient documentation

## 2019-05-11 DIAGNOSIS — Z79899 Other long term (current) drug therapy: Secondary | ICD-10-CM | POA: Diagnosis not present

## 2019-05-11 DIAGNOSIS — Z7982 Long term (current) use of aspirin: Secondary | ICD-10-CM | POA: Diagnosis not present

## 2019-05-11 DIAGNOSIS — E119 Type 2 diabetes mellitus without complications: Secondary | ICD-10-CM | POA: Diagnosis not present

## 2019-05-11 LAB — URINALYSIS, COMPLETE (UACMP) WITH MICROSCOPIC
Bacteria, UA: NONE SEEN
Bilirubin Urine: NEGATIVE
Glucose, UA: NEGATIVE mg/dL
Hgb urine dipstick: NEGATIVE
Ketones, ur: 5 mg/dL — AB
Leukocytes,Ua: NEGATIVE
Nitrite: NEGATIVE
Protein, ur: 100 mg/dL — AB
Specific Gravity, Urine: 1.026 (ref 1.005–1.030)
pH: 5 (ref 5.0–8.0)

## 2019-05-11 LAB — CBC WITH DIFFERENTIAL/PLATELET
Abs Immature Granulocytes: 0.03 10*3/uL (ref 0.00–0.07)
Basophils Absolute: 0 10*3/uL (ref 0.0–0.1)
Basophils Relative: 0 %
Eosinophils Absolute: 0.2 10*3/uL (ref 0.0–0.5)
Eosinophils Relative: 2 %
HCT: 46 % (ref 39.0–52.0)
Hemoglobin: 15.1 g/dL (ref 13.0–17.0)
Immature Granulocytes: 0 %
Lymphocytes Relative: 12 %
Lymphs Abs: 1.3 10*3/uL (ref 0.7–4.0)
MCH: 31 pg (ref 26.0–34.0)
MCHC: 32.8 g/dL (ref 30.0–36.0)
MCV: 94.5 fL (ref 80.0–100.0)
Monocytes Absolute: 1.2 10*3/uL — ABNORMAL HIGH (ref 0.1–1.0)
Monocytes Relative: 11 %
Neutro Abs: 8 10*3/uL — ABNORMAL HIGH (ref 1.7–7.7)
Neutrophils Relative %: 75 %
Platelets: 209 10*3/uL (ref 150–400)
RBC: 4.87 MIL/uL (ref 4.22–5.81)
RDW: 14.3 % (ref 11.5–15.5)
WBC: 10.8 10*3/uL — ABNORMAL HIGH (ref 4.0–10.5)
nRBC: 0 % (ref 0.0–0.2)

## 2019-05-11 LAB — COMPREHENSIVE METABOLIC PANEL
ALT: 19 U/L (ref 0–44)
AST: 14 U/L — ABNORMAL LOW (ref 15–41)
Albumin: 4.2 g/dL (ref 3.5–5.0)
Alkaline Phosphatase: 78 U/L (ref 38–126)
Anion gap: 10 (ref 5–15)
BUN: 27 mg/dL — ABNORMAL HIGH (ref 6–20)
CO2: 24 mmol/L (ref 22–32)
Calcium: 9.7 mg/dL (ref 8.9–10.3)
Chloride: 103 mmol/L (ref 98–111)
Creatinine, Ser: 1.32 mg/dL — ABNORMAL HIGH (ref 0.61–1.24)
GFR calc Af Amer: >60 mL/min (ref >60)
GFR calc non Af Amer: 58 mL/min — ABNORMAL LOW (ref >60)
Glucose, Bld: 148 mg/dL — ABNORMAL HIGH (ref 70–99)
Potassium: 4.2 mmol/L (ref 3.5–5.1)
Sodium: 137 mmol/L (ref 135–145)
Total Bilirubin: 0.8 mg/dL (ref 0.3–1.2)
Total Protein: 8.1 g/dL (ref 6.5–8.1)

## 2019-05-11 LAB — CK: Total CK: 68 U/L (ref 49–397)

## 2019-05-11 LAB — LACTIC ACID, PLASMA
Lactic Acid, Venous: 1.1 mmol/L (ref 0.5–1.9)
Lactic Acid, Venous: 1.2 mmol/L (ref 0.5–1.9)

## 2019-05-11 MED ORDER — DOXYCYCLINE HYCLATE 100 MG PO TABS
100.0000 mg | ORAL_TABLET | Freq: Once | ORAL | Status: AC
  Administered 2019-05-11: 100 mg via ORAL
  Filled 2019-05-11: qty 1

## 2019-05-11 MED ORDER — SODIUM CHLORIDE 0.9 % IV BOLUS
1000.0000 mL | Freq: Once | INTRAVENOUS | Status: AC
  Administered 2019-05-11: 1000 mL via INTRAVENOUS

## 2019-05-11 MED ORDER — DOXYCYCLINE HYCLATE 100 MG PO CAPS: 100.0000 mg | ORAL_CAPSULE | Freq: Two times a day (BID) | ORAL | 0 refills | Status: AC

## 2019-05-11 MED ORDER — CEPHALEXIN 500 MG PO CAPS: 500.0000 mg | ORAL_CAPSULE | Freq: Four times a day (QID) | ORAL | 0 refills | Status: AC

## 2019-05-11 NOTE — Progress Notes (Signed)
BP 126/83    Pulse 90    Wt 205 lb (93 kg)    BMI 26.32 kg/m    Subjective:    Patient ID: Shawn Moore, male    DOB: 07/05/1961, 58 y.o.   MRN: [id number]  HPI: Shawn Moore is a 58 y.o. male  Chief Complaint  Patient presents with   Knee Pain    L knee, states it is warm, red and swollen. States he has had a fever, headache, and body aches as well      This visit was completed via Doximity due to the restrictions of the COVID-19 pandemic. All issues as above were discussed and addressed. Physical exam was done as above through visual confirmation on Doximity. If it was felt that the patient should be evaluated in the office, they were directed there. The patient verbally consented to this visit.  Location of the patient: home  Location of the provider: home  Those involved with this call:   Provider: Aura DialsJolene Rutha Melgoza, DNP  CMA: Sheilah MinsJada Fox, CMA  Front Desk/Registration: Harriet PhoJoliza Johnson   Time spent on call: 15 minutes with patient face to face via video conference. More than 50% of this time was spent in counseling and coordination of care. 10 minutes total spent in review of patient's record and preparation of their chart.   I verified patient identity using two factors (patient name and date of birth). Patient consents verbally to being seen via telemedicine visit today.    KNEE SWELLING (LEFT):  Started on Monday having "some issues" and then Tuesday he woke-up with a red knee with swelling.  Has had fever, chills, headaches, + sweating "profusely" in bed since the knee issue started.  Does not recall getting bit by anything, has had similar symptoms in past when had bites by wasps.  Does not recall any trauma to knee.  Reports it is improving some, with swelling decreasing. Feels he is about 50% better today.  He has been taking Cephalexin (started yesterday has taken four of those and noticed improvement) and has Minocycline (has not taken as of yet). States these are  leftover from his son's past knee surgeries.  He has not been taking temperature at home, but thinks it "has been at least 102".   Discussed with him at length the importance of a face to face visit for this concern and we discussed possible risk for joint infection.  He does not wish to go to ER, but us agreeable to going to ortho urgent care for face to face time.    Duration: days Involved knee: left Mechanism of injury: unknown Location:diffuse Onset: gradual Severity: mild  Quality:  Initially dull aching, but improved today Frequency: intermittent Radiation: no Aggravating factors: movement Alleviating factors: rest and Ibuprofen Status: stable Treatments attempted: ibuprofen  Relief with NSAIDs?:  moderate Weakness with weight bearing or walking: no Sensation of giving way: no Locking: no Popping: no Bruising: no Swelling: yes Redness: yes Paresthesias/decreased sensation: no Fevers: yes  Relevant past medical, surgical, family and social history reviewed and updated as indicated. Interim medical history since our last visit reviewed. Allergies and medications reviewed and updated.  Review of Systems  Constitutional: Positive for chills, diaphoresis and fever. Negative for activity change and fatigue.  HENT: Negative for congestion, ear discharge, ear pain, postnasal drip, rhinorrhea, sinus pressure, sinus pain, sneezing, sore throat and voice change.   Respiratory: Negative for cough, chest tightness, shortness of breath and wheezing.  Cardiovascular: Negative for chest pain, palpitations and leg swelling.  Gastrointestinal: Negative for abdominal distention, abdominal pain, constipation, diarrhea, nausea and vomiting.  Musculoskeletal: Positive for joint swelling.  Skin: Negative.   Neurological: Negative for dizziness, syncope, weakness, light-headedness, numbness and headaches.  Psychiatric/Behavioral: Negative.     Per HPI unless specifically indicated  above     Objective:    BP 126/83    Pulse 90    Wt 205 lb (93 kg)    BMI 26.32 kg/m   Wt Readings from Last 3 Encounters:  05/11/19 205 lb (93 kg)  02/08/19 223 lb (101.2 kg)  12/01/17 216 lb (98 kg)    Physical Exam Vitals signs and nursing note reviewed.  Constitutional:      General: He is awake. He is not in acute distress.    Appearance: He is well-developed. He is not ill-appearing.  HENT:     Head: Normocephalic.     Right Ear: Hearing normal. No drainage.     Left Ear: Hearing normal. No drainage.     Nose: No rhinorrhea.  Eyes:     General: Lids are normal.        Right eye: No discharge.        Left eye: No discharge.     Conjunctiva/sclera: Conjunctivae normal.  Neck:     Musculoskeletal: Normal range of motion.  Cardiovascular:     Comments: Unable to auscultate due to virtual exam only  Pulmonary:     Effort: Pulmonary effort is normal. No accessory muscle usage or respiratory distress.     Comments: Unable to auscultate due to virtual exam only.  No SOB with talking.  Musculoskeletal:     Right knee: He exhibits normal range of motion, no swelling, no ecchymosis, no laceration and no erythema. No tenderness found.     Left knee: He exhibits swelling and erythema. He exhibits normal range of motion, no ecchymosis and no laceration. Tenderness found.     Comments: Viewed via virtual with assistance from patient.  Mild erythema noted extending from anterior upper aspect of left knee to approximately mid-shin. Mild edema noted.  Patient reports some tenderness and warmth to knee with palpation.  Denies noting any bite marks or lacerations.  Able to move knee without difficulty during virtual exam.  Neurological:     Mental Status: He is alert and oriented to person, place, and time.  Psychiatric:        Mood and Affect: Mood normal.        Behavior: Behavior normal. Behavior is cooperative.        Thought Content: Thought content normal.        Judgment:  Judgment normal.     Results for orders placed or performed in visit on 02/08/19  Hepatitis C antibody  Result Value Ref Range   Hep C Virus Ab <0.1 0.0 - 0.9 s/co ratio  CBC with Differential/Platelet  Result Value Ref Range   WBC 5.7 3.4 - 10.8 x10E3/uL   RBC 5.30 4.14 - 5.80 x10E6/uL   Hemoglobin 15.8 13.0 - 17.7 g/dL   Hematocrit 82.947.2 56.237.5 - 51.0 %   MCV 89 79 - 97 fL   MCH 29.8 26.6 - 33.0 pg   MCHC 33.5 31.5 - 35.7 g/dL   RDW 13.014.3 86.511.6 - 78.415.4 %   Platelets 233 150 - 450 x10E3/uL   Neutrophils 52 Not Estab. %   Lymphs 33 Not Estab. %   Monocytes 10 Not Estab. %  Eos 4 Not Estab. %   Basos 1 Not Estab. %   Neutrophils Absolute 3.0 1.4 - 7.0 x10E3/uL   Lymphocytes Absolute 1.9 0.7 - 3.1 x10E3/uL   Monocytes Absolute 0.5 0.1 - 0.9 x10E3/uL   EOS (ABSOLUTE) 0.2 0.0 - 0.4 x10E3/uL   Basophils Absolute 0.0 0.0 - 0.2 x10E3/uL   Immature Granulocytes 0 Not Estab. %   Immature Grans (Abs) 0.0 0.0 - 0.1 x10E3/uL  Comprehensive metabolic panel  Result Value Ref Range   Glucose 204 (H) 65 - 99 mg/dL   BUN 17 8 - 27 mg/dL   Creatinine, Ser 0.98 0.76 - 1.27 mg/dL   GFR calc non Af Amer 83 >59 mL/min/1.73   GFR calc Af Amer 96 >59 mL/min/1.73   BUN/Creatinine Ratio 17 10 - 24   Sodium 137 134 - 144 mmol/L   Potassium 4.6 3.5 - 5.2 mmol/L   Chloride 99 96 - 106 mmol/L   CO2 25 20 - 29 mmol/L   Calcium 9.7 8.6 - 10.2 mg/dL   Total Protein 6.6 6.0 - 8.5 g/dL   Albumin 4.7 3.8 - 4.9 g/dL   Globulin, Total 1.9 1.5 - 4.5 g/dL   Albumin/Globulin Ratio 2.5 (H) 1.2 - 2.2   Bilirubin Total 0.5 0.0 - 1.2 mg/dL   Alkaline Phosphatase 123 (H) 39 - 117 IU/L   AST 18 0 - 40 IU/L   ALT 34 0 - 44 IU/L  TSH  Result Value Ref Range   TSH 3.220 0.450 - 4.500 uIU/mL  UA/M w/rflx Culture, Routine   Specimen: Urine   URINE  Result Value Ref Range   Specific Gravity, UA 1.010 1.005 - 1.030   pH, UA 5.5 5.0 - 7.5   Color, UA Yellow Yellow   Appearance Ur Clear Clear   Leukocytes,UA  Negative Negative   Protein,UA Negative Negative/Trace   Glucose, UA Trace (A) Negative   Ketones, UA Negative Negative   RBC, UA Negative Negative   Bilirubin, UA Negative Negative   Urobilinogen, Ur 0.2 0.2 - 1.0 mg/dL   Nitrite, UA Negative Negative  HgB A1c  Result Value Ref Range   Hgb A1c MFr Bld 9.9 (H) 4.8 - 5.6 %   Est. average glucose Bld gHb Est-mCnc 237 mg/dL  Lipid Panel w/o Chol/HDL Ratio  Result Value Ref Range   Cholesterol, Total 194 100 - 199 mg/dL   Triglycerides 132 0 - 149 mg/dL   HDL 43 >39 mg/dL   VLDL Cholesterol Cal 26 5 - 40 mg/dL   LDL Calculated 125 (H) 0 - 99 mg/dL  PSA  Result Value Ref Range   Prostate Specific Ag, Serum 0.9 0.0 - 4.0 ng/mL      Assessment & Plan:   Problem List Items Addressed This Visit      Other   Pain and swelling of left knee    Acute.  Due to restrictions of virtual visit, have recommended he be seen immediately at ortho urgent care for face to face visit.  He refuses ER visit.  Needs face to face evaluation of joint, which was discussed with him at length by provider.  He was able to verbalize this back and reports he will go there today.  Script for Keflex sent.  Follow-up in 1 week.  If worsening symptoms he is aware of need to go to ER immediately.          Follow up plan: Return in about 1 week (around 05/18/2019) for Knee swelling.

## 2019-05-11 NOTE — ED Notes (Signed)
Ultrasound at bedside

## 2019-05-11 NOTE — ED Triage Notes (Signed)
Pt presents to ED via POV with c/o big bite to L leg, pt states fever, chills, redness that is spreading down his leg and HA since then.

## 2019-05-11 NOTE — ED Notes (Addendum)
Pt st "I'm feeling hot, I think I have a fever" Pt's oral temp 98.7

## 2019-05-11 NOTE — Assessment & Plan Note (Signed)
Acute.  Due to restrictions of virtual visit, have recommended he be seen immediately at ortho urgent care for face to face visit.  He refuses ER visit.  Needs face to face evaluation of joint, which was discussed with him at length by provider.  He was able to verbalize this back and reports he will go there today.  Script for Keflex sent.  Follow-up in 1 week.  If worsening symptoms he is aware of need to go to ER immediately.

## 2019-05-11 NOTE — ED Provider Notes (Signed)
[facility] [address] Emergency Department Provider Note  ____________________________________________  Time seen: Approximately 8:20 PM  I have reviewed the triage vital signs and the nursing notes.   HISTORY  Chief Complaint Insect Bite    HPI Shawn Moore is a 58 y.o. male with a history of hypertension hyperlipidemia and diabetes who comes the ED complaining of subjective fevers and chills for the past 2 days associated redness of the left leg.  He notes that over the last 2 weeks he has been doing a lot of work outside on a car, kneeling on his knees on a concrete area.  He does have an abrasion over the left prepatellar space without laceration.  Denies any focal trauma. Also notes night sweats and body aches as well as fatigue and bilateral frontal headache.  No neck pain or stiffness.  Symptoms are waxing and waning, constant, no aggravating or alleviating factors.   Patient also notes that about a week ago he found a tick on his left leg but does not think that it bit him.   Past Medical History:  Diagnosis Date  . Alcohol abuse   . Hyperlipidemia   . Hypertension   . Injury of back due to fall      Patient Active Problem List   Diagnosis Date Noted  . Pain and swelling of left knee 05/11/2019  . Hyperlipidemia 02/08/2019  . Depression, major, single episode, severe ([place]) 11/10/2017  . Arm pain 10/08/2015  . Degenerative disc disease, cervical 10/08/2015  . Essential hypertension 08/15/2015  . 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 TONSILLECTOMY       Prior to Admission medications   Medication Sig Start Date End Date Taking? Authorizing Provider  aspirin EC 81 MG tablet Take 81 mg by mouth daily.    [provider]  benazepril (LOTENSIN) 40 MG tablet Take 1 tablet (40 mg total) by mouth daily. 02/07/19   Guadalupe Maple, MD  cephALEXin (KEFLEX)  500 MG capsule Take 1 capsule (500 mg total) by mouth 4 (four) times daily for 7 days. 05/11/19 05/18/19  Marnee Guarneri T, NP  doxycycline (VIBRAMYCIN) 100 MG capsule Take 1 capsule (100 mg total) by mouth 2 (two) times daily for 10 days. 05/11/19 05/21/19  Carrie Mew, MD  metFORMIN (GLUCOPHAGE) 500 MG tablet Take 2 tablets (1,000 mg total) by mouth 2 (two) times daily with a meal. 500 at bedtime1 wk, 1inAM & PM 1 wk then 2 in PM and 1 am for 1 wk 02/22/19   Guadalupe Maple, MD  metoprolol succinate (TOPROL-XL) 25 MG 24 hr tablet Take 1 tablet (25 mg total) by mouth daily. 03/21/19   Guadalupe Maple, MD     Allergies Patient has no known allergies.   Family History  Problem Relation Age of Onset  . Kidney disease Father   . Diabetes Father   . Cancer Father   . Healthy Mother     Social History Social History   Tobacco Use  . Smoking status: Never Smoker  . Smokeless tobacco: Never Used  Substance Use Topics  . 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 Drug use: No    Review of Systems  Constitutional: Positive subjective fevers and chills chills.  ENT:   No sore throat. No rhinorrhea. Cardiovascular:   No chest pain or syncope. Respiratory:   No dyspnea or  cough. Gastrointestinal:   Negative for abdominal pain, vomiting and diarrhea.  Musculoskeletal:   Positive left leg pain and erythema All other systems reviewed and are negative except as documented above in ROS and HPI.  ____________________________________________   PHYSICAL EXAM:  VITAL SIGNS: ED Triage Vitals  Enc Vitals Group     BP 05/11/19 1445 (!) 103/56     Pulse Rate 05/11/19 1445 89     Resp 05/11/19 1445 18     Temp 05/11/19 1445 98.6 F (37 C)     Temp Source 05/11/19 1445 Oral     SpO2 05/11/19 1445 98 %     Weight 05/11/19 1446 205 lb (93 kg)     Height 05/11/19 1446 6\' 2"  (1.88 m)     Head Circumference --      Peak Flow --      Pain Score 05/11/19 1446 2      Pain Loc --      Pain Edu? --      Excl. in GC? --     Vital signs reviewed, nursing assessments reviewed.   Constitutional:   Alert and oriented. Non-toxic appearance. Eyes:   Conjunctivae are normal. EOMI. PERRL. ENT      Head:   Normocephalic and atraumatic.      Nose:   No congestion/rhinnorhea.       Mouth/Throat:   MMM, no pharyngeal erythema. No peritonsillar mass.       Neck:   No meningismus. Full ROM. Hematological/Lymphatic/Immunilogical:   No cervical lymphadenopathy. Cardiovascular:   RRR. Symmetric bilateral radial and DP pulses.  No murmurs. Cap refill less than 2 seconds. Respiratory:   Normal respiratory effort without tachypnea/retractions. Breath sounds are clear and equal bilaterally. No wheezes/rales/rhonchi. Gastrointestinal:   Soft and nontender. Non distended. There is no CVA tenderness.  No rebound, rigidity, or guarding.  Musculoskeletal:   Normal range of motion in all extremities.  No significant pain with range of motion of the left knee.  No joint effusions.  No lower extremity tenderness.  Symmetric calf circumference.  No edema. Neurologic:   Normal speech and language.  Motor grossly intact. No acute focal neurologic deficits are appreciated.  Skin:    Skin is warm, dry with confluent erythema of the left lower leg extending from the proximal left tibia down to lower shin.  Covers the anterior half of the leg circumference.  No induration fluctuance or crepitus.  No lymphangitis.  There is a small abrasion over the prepatellar space without laceration or focally worse skin findings.  No rash noted.  No petechiae, purpura, or bullae. No focal bite wounds, no tics found on skin exam ____________________________________________    LABS (pertinent positives/negatives) (all labs ordered are listed, but only abnormal results are displayed) Labs Reviewed  COMPREHENSIVE METABOLIC PANEL - Abnormal; Notable for the following components:      Result Value    Glucose, Bld 148 (*)    BUN 27 (*)    Creatinine, Ser 1.32 (*)    AST 14 (*)    GFR calc non Af Amer 58 (*)    All other components within normal limits  CBC WITH DIFFERENTIAL/PLATELET - Abnormal; Notable for the following components:   WBC 10.8 (*)    Neutro Abs 8.0 (*)    Monocytes Absolute 1.2 (*)    All other components within normal limits  URINALYSIS, COMPLETE (UACMP) WITH MICROSCOPIC - Abnormal; Notable for the following components:   Color, Urine AMBER (*)  APPearance HAZY (*)    Ketones, ur 5 (*)    Protein, ur 100 (*)    All other components within normal limits  NOVEL CORONAVIRUS, NAA (HOSPITAL ORDER, SEND-OUT TO REF LAB)  LACTIC ACID, PLASMA  LACTIC ACID, PLASMA  CK   ____________________________________________   EKG    ____________________________________________    RADIOLOGY  [place] Venous Img Lower Unilateral Left  Result Date: 05/11/2019 CLINICAL DATA:  58 year old male with left lower extremity edema x2 days. EXAM: Left LOWER EXTREMITY VENOUS DOPPLER ULTRASOUND TECHNIQUE: Gray-scale sonography with graded compression, as well as color Doppler and duplex ultrasound were performed to evaluate the lower extremity deep venous systems from the level of the common femoral vein and including the common femoral, femoral, profunda femoral, popliteal and calf veins including the posterior tibial, peroneal and gastrocnemius veins when visible. The superficial great saphenous vein was also interrogated. Spectral Doppler was utilized to evaluate flow at rest and with distal augmentation maneuvers in the common femoral, femoral and popliteal veins. COMPARISON:  None. FINDINGS: Contralateral Common Femoral Vein: Respiratory phasicity is normal and symmetric with the symptomatic side. No evidence of thrombus. Normal compressibility. Common Femoral Vein: No evidence of thrombus. Normal compressibility, respiratory phasicity and response to augmentation. Saphenofemoral Junction: No  evidence of thrombus. Normal compressibility and flow on color Doppler imaging. Profunda Femoral Vein: No evidence of thrombus. Normal compressibility and flow on color Doppler imaging. Femoral Vein: No evidence of thrombus. Normal compressibility, respiratory phasicity and response to augmentation. Popliteal Vein: No evidence of thrombus. Normal compressibility, respiratory phasicity and response to augmentation. Calf Veins: No evidence of thrombus. Normal compressibility and flow on color Doppler imaging. Superficial Great Saphenous Vein: No evidence of thrombus. Normal compressibility. Venous Reflux:  None. Other Findings: There is subcutaneous soft tissue edema over the area of redness. Clinical correlation is recommended to evaluate for cellulitis. IMPRESSION: No evidence of deep venous thrombosis. Electronically Signed   By: Elgie CollardArash  Radparvar M.D.   On: 05/11/2019 20:02   Dg Chest Portable 1 View  Result Date: 05/11/2019 CLINICAL DATA:  Left lower extremity swelling, redness, chills. Body aches. Fatigue. EXAM: PORTABLE CHEST 1 VIEW COMPARISON:  None. FINDINGS: The cardiomediastinal contours are normal. The lungs are clear. Pulmonary vasculature is normal. No consolidation, pleural effusion, or pneumothorax. No acute osseous abnormalities are seen. 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 IMPRESSION: No acute chest findings. Electronically Signed   By: Narda RutherfordMelanie  Sanford M.D.   On: 05/11/2019 19:32    ____________________________________________   PROCEDURES Procedures  ____________________________________________  DIFFERENTIAL DIAGNOSIS   Cellulitis, tickborne illness, DVT, COVID.  Doubt abscess osteomyelitis necrotizing fasciitis septic arthritis fracture.  CLINICAL IMPRESSION / ASSESSMENT AND PLAN / ED COURSE  Medications ordered in the ED: Medications  doxycycline (VIBRA-TABS) tablet 100 mg (has no administration in time range)   sodium chloride 0.9 % bolus 1,000 mL (1,000 mLs Intravenous New Bag/Given 05/11/19 1930)    Pertinent labs & imaging results that were available during my care of the patient were reviewed by me and considered in my medical decision making (see chart for details).  Shawn Moore was evaluated in Emergency Department on 05/11/2019 for the symptoms described in the history of present illness. He was evaluated in the context of the global COVID-19 pandemic, which necessitated consideration that the patient might be at risk for infection with the SARS-CoV-2 virus that causes COVID-19. Institutional protocols and algorithms that pertain to the evaluation of patients at risk for COVID-19  are in a state of rapid change based on information released by regulatory bodies including the CDC and federal and state organizations. These policies and algorithms were followed during the patient's care in the ED.   Patient presents with multiple constitutional symptoms as well as erythema of the left leg.  Most consistent with a tickborne illness.  Vital signs are normal in the ED.  Labs are all unremarkable.  Ultrasound negative for DVT.  Given the pandemic I have sent a COVID swab and given instructions on quarantine at home until this is resulted.  I will start him on doxycycline which should cover tickborne illnesses as well as skin staph and strep.  Plan for follow-up with primary care within the next week, return to ED if worsening.      ____________________________________________   FINAL CLINICAL IMPRESSION(S) / ED DIAGNOSES    Final diagnoses:  Cellulitis of left lower extremity     ED Discharge Orders         Ordered    doxycycline (VIBRAMYCIN) 100 MG capsule  2 times daily     05/11/19 2019          Portions of this note were generated with dragon dictation software. Dictation errors may occur despite best attempts at proofreading.   Sharman CheekStafford, Ronniesha Seibold, MD 05/11/19 2024

## 2019-05-11 NOTE — Telephone Encounter (Signed)
Pt called in c/o his left knee being swollen, red, painful and stiff.   He is also having body aches, chills, night sweats and headaches and feels terrible.  See triage notes.   I warm transferred the call into Dr. Rance Muir office for scheduling.  I sent my triage notes to Dr Rance Muir office.  Reason for Disposition . [1] Redness of the skin AND [2] no fever  Answer Assessment - Initial Assessment Questions 1. ONSET: "When did the swelling start?" (e.g., minutes, hours, days)     I've been working on my car. I'm on a concrete pad.   I had a tick but it was not attached.    This morning my knee is red and hot and going 1/2 way down my shin.    This happens with bee stings.    I've had fevers, night sweats, chills, body aches and headaches.    2. LOCATION: "What part of the leg is swollen?"  "Are both legs swollen or just one leg?"     Left knee 1/2 down my shin.  3. SEVERITY: "How bad is the swelling?" (e.g., localized; mild, moderate, severe)  - Localized - small area of swelling localized to one leg  - MILD pedal edema - swelling limited to foot and ankle, pitting edema < 1/4 inch (6 mm) deep, rest and elevation eliminate most or all swelling  - MODERATE edema - swelling of lower leg to knee, pitting edema > 1/4 inch (6 mm) deep, rest and elevation only partially reduce swelling  - SEVERE edema - swelling extends above knee, facial or hand swelling present      It's getting better.   But I'm concerned with the other symptoms. 4. REDNESS: "Does the swelling look red or infected?"     Redness     I'm taking antihistamines.   I'm taking an antibiotic I had left over.   Cefalixin 500 mg and Amenocycline 5. PAIN: "Is the swelling painful to touch?" If so, ask: "How painful is it?"   (Scale 1-10; mild, moderate or severe)     It's been painful.    It's still stiff, swoll and hot 6. FEVER: "Do you have a fever?" If so, ask: "What is it, how was it measured, and when did it start?"      I'm  not sure 7. CAUSE: "What do you think is causing the leg swelling?"     Maybe lyme diease.      8. MEDICAL HISTORY: "Do you have a history of heart failure, kidney disease, liver failure, or cancer?"     No 9. RECURRENT SYMPTOM: "Have you had leg swelling before?" If so, ask: "When was the last time?" "What happened that time?"     No 10. OTHER SYMPTOMS: "Do you have any other symptoms?" (e.g., chest pain, difficulty breathing)       See above 11. PREGNANCY: "Is there any chance you are pregnant?" "When was your last menstrual period?"       N/A  Answer Assessment - Initial Assessment Questions 1. LOCATION: "Where is the swelling located?"  (e.g., left, right, both knees)     My left knee and 1/2 way down my shin. 2. SIZE and DESCRIPTION: "What does the swelling look like?"  (e.g., entire knee, localized)     My knee is red and hot, painful and stiff.  It's getting better but I'm having body aches, chills, headaches and feel bad.   Not sure if I have fever  or not. 3. ONSET: "When did the swelling start?" "Does it come and go, or is it there all the time?"     A few days ago 4. PAIN: "Is there any pain?" If so, ask: "How bad is it?" (Scale 1-10; or mild, moderate, severe)     Yes and stiffness 5. SETTING: "Has there been any recent work, exercise or other activity that involved that part of the body?"      I was laying on a concrete pad working on my car. 6. AGGRAVATING FACTORS: "What makes the knee swelling worse?" (e.g., walking, climbing stairs, running)     It's stiff 7. ASSOCIATED SYMPTOMS: "Is there any pain or redness?"     Yes both 8. OTHER SYMPTOMS: "Do you have any other symptoms?" (e.g., chest pain, difficulty breathing, fever, calf pain)     Body aches, chills, headaches and feel bad. 9. PREGNANCY: "Is there any chance you are pregnant?" "When was your last menstrual period?"     N/A  Protocols used: KNEE SWELLING-A-AH, LEG SWELLING AND EDEMA-A-AH

## 2019-05-11 NOTE — Patient Instructions (Signed)

## 2019-05-11 NOTE — Discharge Instructions (Addendum)
Your lab tests and ultrasound of the left leg were all okay.  This is most likely a skin infection which can be treated with doxycycline.  We sent a coronavirus test today which should come back in the next 2 to 3 days.  In the meantime, you should quarantine at home until the result is available.  If it comes back positive you will need to quarantine for longer.     Person Under Monitoring Name: Shawn Moore  Location: [place] [address]   Infection Prevention Recommendations for Individuals Confirmed to have, or Being Evaluated for, 2019 Novel Coronavirus (COVID-19) Infection Who Receive Care at Home  Individuals who are confirmed to have, or are being evaluated for, COVID-19 should follow the prevention steps below until a healthcare provider or local or state health department says they can return to normal activities.  Stay home except to get medical care You should restrict activities outside your home, except for getting medical care. Do not go to work, school, or public areas, and do not use public transportation or taxis.  Call ahead before visiting your doctor Before your medical appointment, call the healthcare provider and tell them that you have, or are being evaluated for, COVID-19 infection. This will help the healthcare providers office take steps to keep other people from getting infected. Ask your healthcare provider to call the local or state health department.  Monitor your symptoms Seek prompt medical attention if your illness is worsening (e.g., difficulty breathing). Before going to your medical appointment, call the healthcare provider and tell them that you have, or are being evaluated for, COVID-19 infection. Ask your healthcare provider to call the local or state health department.  Wear a facemask You should wear a facemask that covers your nose and mouth when you are in the same room with other people and when you visit a healthcare  provider. People who live with or visit you should also wear a facemask while they are in the same room with you.  Separate yourself from other people in your home As much as possible, you should stay in a different room from other people in your home. Also, you should use a separate bathroom, if available.  Avoid sharing household items You should not share dishes, drinking glasses, cups, eating utensils, towels, bedding, or other items with other people in your home. After using these items, you should wash them thoroughly with soap and water.  Cover your coughs and sneezes Cover your mouth and nose with a tissue when you cough or sneeze, or you can cough or sneeze into your sleeve. Throw used tissues in a lined trash can, and immediately wash your hands with soap and water for at least 20 seconds or use an alcohol-based hand rub.  Wash your [facility] your hands often and thoroughly with soap and water for at least 20 seconds. You can use an alcohol-based hand sanitizer if soap and water are not available and if your hands are not visibly dirty. Avoid touching your eyes, nose, and mouth with unwashed hands.   Prevention Steps for Caregivers and Household Members of Individuals Confirmed to have, or Being Evaluated for, COVID-19 Infection Being Cared for in the Home  If you live with, or provide care at home for, a person confirmed to have, or being evaluated for, COVID-19 infection please follow these guidelines to prevent infection:  Follow healthcare providers instructions Make sure that you understand and can help the patient follow  any healthcare provider instructions for all care.  Provide for the patients basic needs You should help the patient with basic needs in the home and provide support for getting groceries, prescriptions, and other personal needs.  Monitor the patients symptoms If they are getting sicker, call his or her medical provider and tell them that the  patient has, or is being evaluated for, COVID-19 infection. This will help the healthcare providers office take steps to keep other people from getting infected. Ask the healthcare provider to call the local or state health department.  Limit the number of people who have contact with the patient If possible, have only one caregiver for the patient. Other household members should stay in another home or place of residence. If this is not possible, they should stay in another room, or be separated from the patient as much as possible. Use a separate bathroom, if available. Restrict visitors who do not have an essential need to be in the home.  Keep older adults, very young children, and other sick people away from the patient Keep older adults, very young children, and those who have compromised immune systems or chronic health conditions away from the patient. This includes people with chronic heart, lung, or kidney conditions, diabetes, and cancer.  Ensure good ventilation Make sure that shared spaces in the home have good air flow, such as from an air conditioner or an opened window, weather permitting.  Wash your hands often Wash your hands often and thoroughly with soap and water for at least 20 seconds. You can use an alcohol based hand sanitizer if soap and water are not available and if your hands are not visibly dirty. Avoid touching your eyes, nose, and mouth with unwashed hands. Use disposable paper towels to dry your hands. If not available, use dedicated cloth towels and replace them when they become wet.  Wear a facemask and gloves Wear a disposable facemask at all times in the room and gloves when you touch or have contact with the patients blood, body fluids, and/or secretions or excretions, such as sweat, saliva, sputum, nasal mucus, vomit, urine, or feces.  Ensure the mask fits over your nose and mouth tightly, and do not touch it during use. Throw out disposable facemasks  and gloves after using them. Do not reuse. Wash your hands immediately after removing your facemask and gloves. If your personal clothing becomes contaminated, carefully remove clothing and launder. Wash your hands after handling contaminated clothing. Place all used disposable facemasks, gloves, and other waste in a lined container before disposing them with other household waste. Remove gloves and wash your hands immediately after handling these items.  Do not share dishes, glasses, or other household items with the patient Avoid sharing household items. You should not share dishes, drinking glasses, cups, eating utensils, towels, bedding, or other items with a patient who is confirmed to have, or being evaluated for, COVID-19 infection. After the person uses these items, you should wash them thoroughly with soap and water.  Wash laundry thoroughly Immediately remove and wash clothes or bedding that have blood, body fluids, and/or secretions or excretions, such as sweat, saliva, sputum, nasal mucus, vomit, urine, or feces, on them. Wear gloves when handling laundry from the patient. Read and follow directions on labels of laundry or clothing items and detergent. In general, wash and dry with the warmest temperatures recommended on the label.  Clean all areas the individual has used often Clean all touchable surfaces, such as counters,  tabletops, doorknobs, bathroom fixtures, toilets, phones, keyboards, tablets, and bedside tables, every day. Also, clean any surfaces that may have blood, body fluids, and/or secretions or excretions on them. Wear gloves when cleaning surfaces the patient has come in contact with. Use a diluted bleach solution (e.g., dilute bleach with 1 part bleach and 10 parts water) or a household disinfectant with a label that says EPA-registered for coronaviruses. To make a bleach solution at home, add 1 tablespoon of bleach to 1 quart (4 cups) of water. For a larger supply,  add  cup of bleach to 1 gallon (16 cups) of water. Read labels of cleaning products and follow recommendations provided on product labels. Labels contain instructions for safe and effective use of the cleaning product including precautions you should take when applying the product, such as wearing gloves or eye protection and making sure you have good ventilation during use of the product. Remove gloves and wash hands immediately after cleaning.  Monitor yourself for signs and symptoms of illness Caregivers and household members are considered close contacts, should monitor their health, and will be asked to limit movement outside of the home to the extent possible. Follow the monitoring steps for close contacts listed on the symptom monitoring form.   ? If you have additional questions, contact your local health department or call the epidemiologist on call at [phone] (available 24/7). ? This guidance is subject to change. For the most up-to-date guidance from [facility], please refer to their website: TripMetro.huhttps://www.cdc.gov/coronavirus/2019-ncov/hcp/guidance-prevent-spread.html

## 2019-05-15 ENCOUNTER — Telehealth: Payer: Self-pay | Admitting: Emergency Medicine

## 2019-05-15 LAB — NOVEL CORONAVIRUS, NAA (HOSP ORDER, SEND-OUT TO REF LAB; TAT 18-24 HRS): SARS-CoV-2, NAA: DETECTED — AB

## 2019-05-15 NOTE — Telephone Encounter (Signed)
Called pateint to assure he is aware of covid positive and  He is aware.

## 2019-05-16 ENCOUNTER — Encounter: Payer: Self-pay | Admitting: Family Medicine

## 2019-05-18 ENCOUNTER — Other Ambulatory Visit: Payer: Self-pay

## 2019-05-18 DIAGNOSIS — Z20822 Contact with and (suspected) exposure to covid-19: Secondary | ICD-10-CM

## 2019-05-20 LAB — NOVEL CORONAVIRUS, NAA: SARS-CoV-2, NAA: NOT DETECTED

## 2019-06-01 ENCOUNTER — Encounter: Payer: 59 | Admitting: Family Medicine

## 2019-06-01 ENCOUNTER — Encounter

## 2019-06-19 ENCOUNTER — Other Ambulatory Visit: Payer: Self-pay | Admitting: Family Medicine

## 2019-06-19 MED ORDER — ROSUVASTATIN CALCIUM 10 MG PO TABS: 10.0000 mg | ORAL_TABLET | Freq: Every day | ORAL | 3 refills | Status: DC

## 2019-07-17 ENCOUNTER — Encounter: Payer: Self-pay | Admitting: Family Medicine

## 2019-07-18 MED ORDER — METOPROLOL SUCCINATE ER 25 MG PO TB24: 25.0000 mg | ORAL_TABLET | Freq: Every day | ORAL | 3 refills | Status: DC

## 2019-08-06 LAB — FECAL OCCULT BLOOD, IMMUNOCHEMICAL: IFOBT: NEGATIVE

## 2019-08-21 ENCOUNTER — Other Ambulatory Visit: Payer: Self-pay | Admitting: Family Medicine

## 2019-08-21 MED ORDER — METOPROLOL SUCCINATE ER 25 MG PO TB24: 25.0000 mg | ORAL_TABLET | Freq: Every day | ORAL | 0 refills | Status: DC

## 2019-08-21 NOTE — Telephone Encounter (Signed)
Medication Refill - Medication: metoprolol succinate (TOPROL-XL) 25 MG 24 hr tablet  Has the patient contacted their pharmacy? Yes - pt would like a 90 day supply sent in (Agent: If no, request that the patient contact the pharmacy for the refill.) (Agent: If yes, when and what did the pharmacy advise?)  Preferred Pharmacy (with phone number or street name):  [facility] ([address]), [place] [facility] [place] [phone] (Phone) [phone] (Fax)   Agent: Please be advised that RX refills may take up to 3 business days. We ask that you follow-up with your pharmacy.

## 2019-08-28 ENCOUNTER — Other Ambulatory Visit: Payer: Self-pay

## 2019-08-28 MED ORDER — METFORMIN HCL 500 MG PO TABS: 1000.0000 mg | ORAL_TABLET | Freq: Two times a day (BID) | ORAL | 0 refills | Status: DC

## 2019-08-28 NOTE — Telephone Encounter (Signed)
Appt scheduled for 08/30/19.

## 2019-08-28 NOTE — Telephone Encounter (Signed)
Last f/up appointment was 02/22/19. Please clarify directions, fax from pharmacy states 2 tablets BID

## 2019-08-28 NOTE — Telephone Encounter (Signed)
Needs appointment

## 2019-08-30 ENCOUNTER — Other Ambulatory Visit: Payer: Self-pay

## 2019-08-30 ENCOUNTER — Encounter: Payer: Self-pay | Admitting: Family Medicine

## 2019-08-30 ENCOUNTER — Ambulatory Visit (INDEPENDENT_AMBULATORY_CARE_PROVIDER_SITE_OTHER): Payer: Managed Care, Other (non HMO) | Admitting: Family Medicine

## 2019-08-30 VITALS — BP 158/89 | Ht 74.0 in | Wt 211.0 lb

## 2019-08-30 DIAGNOSIS — I10 Essential (primary) hypertension: Secondary | ICD-10-CM

## 2019-08-30 DIAGNOSIS — F322 Major depressive disorder, single episode, severe without psychotic features: Secondary | ICD-10-CM | POA: Diagnosis not present

## 2019-08-30 DIAGNOSIS — E1169 Type 2 diabetes mellitus with other specified complication: Secondary | ICD-10-CM | POA: Diagnosis not present

## 2019-08-30 DIAGNOSIS — E7800 Pure hypercholesterolemia, unspecified: Secondary | ICD-10-CM

## 2019-08-30 DIAGNOSIS — Z125 Encounter for screening for malignant neoplasm of prostate: Secondary | ICD-10-CM

## 2019-08-30 MED ORDER — ROSUVASTATIN CALCIUM 5 MG PO TABS: 5.0000 mg | ORAL_TABLET | Freq: Every day | ORAL | 1 refills | Status: DC

## 2019-08-30 MED ORDER — BENAZEPRIL HCL 40 MG PO TABS: 40.0000 mg | ORAL_TABLET | Freq: Every day | ORAL | 1 refills | Status: DC

## 2019-08-30 MED ORDER — METOPROLOL SUCCINATE ER 25 MG PO TB24: 25.0000 mg | ORAL_TABLET | Freq: Every day | ORAL | 1 refills | Status: DC

## 2019-08-30 MED ORDER — METFORMIN HCL 500 MG PO TABS: 1000.0000 mg | ORAL_TABLET | Freq: Two times a day (BID) | ORAL | 1 refills | Status: DC

## 2019-08-30 NOTE — Assessment & Plan Note (Signed)
Due for recheck on his A1c. Continue current regimen. Continue to monitor. Call with any concerns. Await results.

## 2019-08-30 NOTE — Assessment & Plan Note (Signed)
Did not start his medicine. Will restart his crestor at lower dose. Call with any concerns.

## 2019-08-30 NOTE — Assessment & Plan Note (Signed)
Under good control off medication. Continue current regimen. Continue to monitor. Call with any concerns.

## 2019-08-30 NOTE — Assessment & Plan Note (Signed)
Under good control on current regimen. Continue current regimen. Continue to monitor. Call with any concerns. Refills given. Will check labs asap. Await results.

## 2019-08-30 NOTE — Progress Notes (Signed)
BP (!) 158/89   Ht '6\' 2"'  (1.88 m)   Wt 211 lb (95.7 kg)   BMI 27.09 kg/m    Subjective:    Patient ID: Shawn Moore, male    DOB: 03-13-1961, 58 y.o.   MRN: [id number]  HPI: Shawn Moore is a 58 y.o. male  Chief Complaint  Patient presents with  . Hypertension  . Hyperlipidemia  . Diabetes  . Depression   HYPERTENSION / HYPERLIPIDEMIA Satisfied with current treatment? yes Duration of hypertension: chronic BP monitoring frequency: a few times a month BP medication side effects: no Past BP meds: benazepril, metoprolol Duration of hyperlipidemia: chronic Cholesterol medication side effects: not on anything- refused Cholesterol supplements: none Past cholesterol medications: none Medication compliance: excellent compliance Aspirin: yes Recent stressors: no Recurrent headaches: no Visual changes: no Palpitations: no Dyspnea: no Chest pain: no Lower extremity edema: no Dizzy/lightheaded: no  DIABETES Hypoglycemic episodes:no Polydipsia/polyuria: no Visual disturbance: no Chest pain: no Paresthesias: no Glucose Monitoring: yes  Accucheck frequency: Daily  Fasting glucose: 151-162 Taking Insulin?: no Blood Pressure Monitoring: not checking Retinal Examination: Not Up to Date Foot Exam: Not Up to Date Diabetic Education: Completed Pneumovax: Not Up to Date Influenza: Not up to Date Aspirin: yes  DEPRESSION Mood status: stable Satisfied with current treatment?: yes Symptom severity: mild  Duration of current treatment : not on anything Psychotherapy/counseling: no  Depressed mood: no Anxious mood: no Anhedonia: no Significant weight loss or gain: no Insomnia: no  Fatigue: yes Feelings of worthlessness or guilt: no Impaired concentration/indecisiveness: no Suicidal ideations: no Hopelessness: no Crying spells: no Depression screen [facility] 2/9 08/30/2019 02/08/2019 11/10/2017  Decreased Interest 0 1 1  Down, Depressed, Hopeless 0 1 1  PHQ - 2 Score 0  2 2  Altered sleeping '2 3 3  ' Tired, decreased energy '1 2 2  ' Change in appetite 0 0 2  Feeling bad or failure about yourself  0 0 1  Trouble concentrating 0 1 1  Moving slowly or fidgety/restless 0 2 1  Suicidal thoughts 0 0 1  PHQ-9 Score '3 10 13  ' Difficult doing work/chores Not difficult at all - -    Relevant past medical, surgical, family and social history reviewed and updated as indicated. Interim medical history since our last visit reviewed. Allergies and medications reviewed and updated.  Review of Systems  Constitutional: Negative.   Respiratory: Negative.   Cardiovascular: Negative.   Musculoskeletal: Negative.   Neurological: Negative.   Psychiatric/Behavioral: Negative.     Per HPI unless specifically indicated above     Objective:    BP (!) 158/89   Ht '6\' 2"'  (1.88 m)   Wt 211 lb (95.7 kg)   BMI 27.09 kg/m   Wt Readings from Last 3 Encounters:  08/30/19 211 lb (95.7 kg)  05/11/19 205 lb (93 kg)  05/11/19 205 lb (93 kg)    Physical Exam Vitals signs and nursing note reviewed.  Constitutional:      General: He is not in acute distress.    Appearance: Normal appearance. He is not ill-appearing, toxic-appearing or diaphoretic.  HENT:     Head: Normocephalic and atraumatic.     Right Ear: External ear normal.     Left Ear: External ear normal.     Nose: Nose normal.     Mouth/Throat:     Mouth: Mucous membranes are moist.     Pharynx: Oropharynx is clear.  Eyes:     General: No scleral icterus.  Right eye: No discharge.        Left eye: No discharge.     Conjunctiva/sclera: Conjunctivae normal.     Pupils: Pupils are equal, round, and reactive to light.  Neck:     Musculoskeletal: Normal range of motion.  Pulmonary:     Effort: Pulmonary effort is normal. No respiratory distress.     Comments: Speaking in full sentences Musculoskeletal: Normal range of motion.  Skin:    Coloration: Skin is not jaundiced or pale.     Findings: No bruising,  erythema, lesion or rash.  Neurological:     Mental Status: He is alert and oriented to person, place, and time. Mental status is at baseline.  Psychiatric:        Mood and Affect: Mood normal.        Behavior: Behavior normal.        Thought Content: Thought content normal.        Judgment: Judgment normal.     Results for orders placed or performed in visit on 05/18/19  Novel Coronavirus, NAA ([facility])  Result Value Ref Range   SARS-CoV-2, NAA Not Detected Not Detected      Assessment & Plan:   Problem List Items Addressed This Visit      Cardiovascular and Mediastinum   Essential hypertension    Under good control on current regimen. Continue current regimen. Continue to monitor. Call with any concerns. Refills given. Will check labs asap. Await results.        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 Continue current regimen. Continue to monitor. Call with any concerns. Await results.       Relevant Medications   rosuvastatin (CRESTOR) 5 MG tablet   metFORMIN (GLUCOPHAGE) 500 MG tablet   benazepril (LOTENSIN) 40 MG tablet   Other Relevant Orders   Bayer DCA Hb A1c Waived   CBC with Differential OUT   Comp Met (CMET)   Lipid Panel w/o Chol/HDL Ratio OUT   Microalbumin, Urine Waived   UA/M w/rflx Culture, Routine     Other   Depression, major, single episode, severe (H[facility])    Under good control off medication. Continue current regimen. Continue to monitor. Call with any concerns.       Relevant Orders   CBC with Differential OUT   Comp Met (CMET)   Hyperlipidemia    Did not start his medicine. Will restart his crestor at lower  dose. Call with any concerns.       Relevant Medications   rosuvastatin (CRESTOR) 5 MG tablet   metoprolol succinate (TOPROL-XL) 25 MG 24 hr tablet   benazepril (LOTENSIN) 40 MG tablet    Other Visit Diagnoses    Screening for prostate cancer       Labs to be drawn ASAP.   Relevant Orders   PSA       Follow up plan: Return in about 3 months (around 11/30/2019) for physical.   . This visit was completed via D[employer]ity due to the restrictions of the COVID-19 pandemic. All issues as above were discussed and addressed. Physical exam  was done as above through visual confirmation on Doximity. If it was felt that the patient should be evaluated in the office, they were directed there. The patient verbally consented to this visit. . Location of the patient: home . Location of the provider: home . Those involved with this call:  . Provider: Park Liter, DO . CMA: Tiffany Reel, CMA . Front Desk/Registration: Don Perking  . Time spent on call: 25 minutes with patient face to face via video conference. More than 50% of this time was spent in counseling and coordination of care. 40 minutes total spent in review of patient's record and preparation of their chart.

## 2019-09-27 ENCOUNTER — Encounter: Payer: Self-pay | Admitting: Family Medicine

## 2020-01-11 ENCOUNTER — Encounter: Payer: Self-pay | Admitting: Family Medicine

## 2020-01-11 ENCOUNTER — Ambulatory Visit (INDEPENDENT_AMBULATORY_CARE_PROVIDER_SITE_OTHER): Payer: 59 | Admitting: Family Medicine

## 2020-01-11 ENCOUNTER — Other Ambulatory Visit: Payer: Self-pay

## 2020-01-11 VITALS — BP 116/71 | HR 75 | Temp 97.1°F | Resp 16 | Ht 74.0 in | Wt 219.0 lb

## 2020-01-11 DIAGNOSIS — H9311 Tinnitus, right ear: Secondary | ICD-10-CM

## 2020-01-11 DIAGNOSIS — Z7689 Persons encountering health services in other specified circumstances: Secondary | ICD-10-CM

## 2020-01-11 DIAGNOSIS — I10 Essential (primary) hypertension: Secondary | ICD-10-CM | POA: Diagnosis not present

## 2020-01-11 DIAGNOSIS — E1169 Type 2 diabetes mellitus with other specified complication: Secondary | ICD-10-CM | POA: Diagnosis not present

## 2020-01-11 DIAGNOSIS — Z20822 Contact with and (suspected) exposure to covid-19: Secondary | ICD-10-CM | POA: Diagnosis not present

## 2020-01-11 DIAGNOSIS — E785 Hyperlipidemia, unspecified: Secondary | ICD-10-CM

## 2020-01-11 NOTE — Assessment & Plan Note (Signed)
History of elevated lipids On Rosuvastatin 5mg  daily F/u with fasting lipids in 3 months at annual

## 2020-01-11 NOTE — Patient Instructions (Addendum)
Thank you for coming to the office today.  Labs today, A1c, COVID antibody  Based on A1c we can review your result and suggest treatment.  Some slight fluid behind R ear drum Can try Flonase OTC nasal steroid Flonase 2 sprays in each nostril daily for 4-6 weeks, may repeat course seasonally or as needed  Left ear has soft ear wax.   DUE for FASTING BLOOD WORK (no food or drink after midnight before the lab appointment, only water or coffee without cream/sugar on the morning of)  SCHEDULE "Lab Only" visit in the morning at the clinic for lab draw in 3 MONTHS   - Make sure Lab Only appointment is at about 1 week before your next appointment, so that results will be available  For Lab Results, once available within 2-3 days of blood draw, you can can log in to MyChart online to view your results and a brief explanation. Also, we can discuss results at next follow-up visit.   Please schedule a Follow-up Appointment to: Return in about 3 months (around 04/12/2020) for Annual Physical.  If you have any other questions or concerns, please feel free to call the office or send a message through MyChart. You may also schedule an earlier appointment if necessary.  Additionally, you may be receiving a survey about your experience at our office within a few days to 1 week by e-mail or mail. We value your feedback.  Saralyn Pilar, DO [facility], [place]
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
## 2020-01-11 NOTE — Assessment & Plan Note (Addendum)
Well-controlled HTN Close BP monitoring w/ [facility] Occasional recent elevated readings No known complications     Plan:  1. Continue current BP regimen Benazepril 40mg  daily (however he said he is taking twice a day) - await results of upcoming chemistry will discuss adjustment to regimen at that time, either switch back to once daily or consider add other 2nd agent low dose 2. Encourage improved lifestyle - low sodium diet, regular exercise 3. Continue monitor BP outside office, bring readings to next visit, if persistently >140/90 or new symptoms notify office sooner 4. Follow-up within 3 months annual / labs

## 2020-01-11 NOTE — Assessment & Plan Note (Signed)
Previously uncontrolled A1c >9 back in early 2020 Now on metformin Goal to improve lifestyle Complicated w/ HLD, HTN  Plan:  1. Continue current therapy - Metformin 1000mg  BID - considering alternative agent or in addition to - likely GLP1 vs SGLT2 will review further once review A1c, discussed preliminary info on these med with patient and he is interested 2. Encourage improved lifestyle - low carb, low sugar diet, reduce portion size, continue improving regular exercise 3. Check CBG , bring log to next visit for review 4. Continue ACEi , Statin 5. DM Foot exam done today Will advise to schedule DM EYE  A1c today lab  f/u result  May adjust regimen based on reading Return w/in 3 months repeat A1c w/ physical labs

## 2020-01-12 LAB — HEMOGLOBIN A1C
Hgb A1c MFr Bld: 6.6 % of total Hgb — ABNORMAL HIGH (ref <5.7)
Mean Plasma Glucose: 143 (calc)
eAG (mmol/L): 7.9 (calc)

## 2020-01-12 LAB — BASIC METABOLIC PANEL WITH GFR
BUN: 15 mg/dL (ref 7–25)
CO2: 27 mmol/L (ref 20–32)
Calcium: 10 mg/dL (ref 8.6–10.3)
Chloride: 103 mmol/L (ref 98–110)
Creat: 1.01 mg/dL (ref 0.70–1.25)
GFR, Est African American: 93 mL/min/{1.73_m2} (ref >=60)
GFR, Est Non African American: 80 mL/min/{1.73_m2} (ref >=60)
Glucose, Bld: 127 mg/dL — ABNORMAL HIGH (ref 65–99)
Potassium: 4.6 mmol/L (ref 3.5–5.3)
Sodium: 138 mmol/L (ref 135–146)

## 2020-01-12 LAB — SAR COV2 SEROLOGY (COVID19)AB(IGG),IA: SARS CoV2 AB IGG: NEGATIVE

## 2020-01-27 ENCOUNTER — Ambulatory Visit: Payer: 59 | Attending: Internal Medicine

## 2020-01-27 DIAGNOSIS — Z23 Encounter for immunization: Secondary | ICD-10-CM

## 2020-01-27 NOTE — Progress Notes (Signed)
   Covid-19 Vaccination Clinic  Name:  FERRIS FIELDEN    MRN: [id number] DOB: 1961-10-12  01/27/2020  Mr. Kempton was observed post Covid-19 immunization for 15 minutes without incident. He was provided with Vaccine Information Sheet and instruction to access the V-Safe system.   Mr. Schuenemann was instructed to call 911 with any severe reactions post vaccine: Marland Kitchen Difficulty breathing  . Swelling of face and throat  . A fast heartbeat  . A bad rash all over body  . Dizziness and weakness   Immunizations Administered    Name Date Dose VIS Date Route   Pfizer COVID-19 Vaccine 01/27/2020  9:24 AM 0.3 mL 09/29/2019 Intramuscular   Manufacturer: [employer], [employer]   Lot: [id number]   NDC: [address]-7

## 2020-02-14 LAB — HM DIABETES EYE EXAM

## 2020-02-20 ENCOUNTER — Ambulatory Visit: Payer: 59 | Attending: Internal Medicine

## 2020-02-20 DIAGNOSIS — Z23 Encounter for immunization: Secondary | ICD-10-CM

## 2020-02-20 NOTE — Progress Notes (Signed)
   Covid-19 Vaccination Clinic  Name:  Shawn Moore    MRN: [id number] DOB: Dec 01, 1960  02/20/2020  Mr. [name] was observed post Covid-19 immunization for 15 minutes without incident. He was provided with Vaccine Information Sheet and instruction to access the V-Safe system.   Mr. [name] was instructed to call 911 with any severe reactions post vaccine: Marland Kitchen Difficulty breathing  . Swelling of face and throat  . A fast heartbeat  . A bad rash all over body  . Dizziness and weakness   Immunizations Administered    Name Date Dose VIS Date Route   Pfizer COVID-19 Vaccine 02/20/2020 12:11 PM 0.3 mL 12/13/2018 Intramuscular   Manufacturer: [employer], [employer]   Lot: [id number]   NDC: [address]-6

## 2020-03-19 ENCOUNTER — Other Ambulatory Visit: Payer: Self-pay | Admitting: Family Medicine

## 2020-03-19 DIAGNOSIS — E1169 Type 2 diabetes mellitus with other specified complication: Secondary | ICD-10-CM

## 2020-03-19 DIAGNOSIS — E785 Hyperlipidemia, unspecified: Secondary | ICD-10-CM

## 2020-03-19 MED ORDER — ROSUVASTATIN CALCIUM 5 MG PO TABS: 5.0000 mg | ORAL_TABLET | Freq: Every day | ORAL | 1 refills | Status: DC

## 2020-03-19 MED ORDER — METFORMIN HCL 500 MG PO TABS: 1000.0000 mg | ORAL_TABLET | Freq: Two times a day (BID) | ORAL | 1 refills | Status: DC

## 2020-03-19 NOTE — Telephone Encounter (Signed)
Requested medication (s) are due for refill today: yes  Requested medication (s) are on the active medication list: yes  Last refill:  08/30/2019  Future visit scheduled: no  Notes to clinic:  last filled by a different provider Review for refills   Requested Prescriptions  Pending Prescriptions Disp Refills   metFORMIN (GLUCOPHAGE) 500 MG tablet 360 tablet 1    Sig: Take 2 tablets (1,000 mg total) by mouth 2 (two) times daily with a meal.      Endocrinology:  Diabetes - Biguanides Failed - 03/19/2020  9:31 AM      Failed - Valid encounter within last 6 months    Recent Outpatient Visits           2 months ago Type 2 diabetes mellitus with other specified complication, without long-term current use of insulin ([place])   [facility], Devonne Doughty, DO   6 months ago Diabetes mellitus associated with hormonal etiology [facility])   [place], Megan P, DO   10 months ago Pain and swelling of left knee   [place], Henrine Screws T, NP   1 year ago Essential hypertension   [facility] Guadalupe Maple, MD   1 year ago Dizziness   [place], [place], [place]              Passed - Cr in normal range and within 360 days    Creat  Date Value Ref Range Status  01/11/2020 1.01 0.70 - 1.25 mg/dL Final    Comment:    For patients >46 years of age, the reference limit for Creatinine is approximately 13% higher for people identified as African-American. .           Passed - HBA1C is between 0 and 7.9 and within 180 days    HB A1C (BAYER DCA - WAIVED)  Date Value Ref Range Status  11/10/2017 6.4 <7.0 % Final    Comment:                                          Diabetic Adult            <7.0                                       Healthy Adult        4.3 - 5.7                                                           (DCCT/NGSP) American Diabetes Association's Summary of Glycemic  Recommendations for Adults with Diabetes: Hemoglobin A1c <7.0%. More stringent glycemic goals (A1c <6.0%) may further reduce complications at the cost of increased risk of hypoglycemia.    Hgb A1c MFr Bld  Date Value Ref Range Status  01/11/2020 6.6 (H) <5.7 % of total Hgb Final    Comment:    For someone without known diabetes, a hemoglobin A1c value of 6.5% or greater indicates that they may have  diabetes and this should be confirmed with a follow-up  test. .  For someone with known diabetes, a value <7% indicates  that their diabetes is well controlled and a value  greater than or equal to 7% indicates suboptimal  control. A1c targets should be individualized based on  duration of diabetes, age, comorbid conditions, and  other considerations. . Currently, no consensus exists regarding use of hemoglobin A1c for diagnosis of diabetes for children. .           Passed - eGFR in normal range and within 360 days    GFR, Est African American  Date Value Ref Range Status  01/11/2020 93 > OR = 60 mL/min/1.96m Final   GFR, Est Non African American  Date Value Ref Range Status  01/11/2020 80 > OR = 60 mL/min/1.743mFinal            rosuvastatin (CRESTOR) 5 MG tablet 90 tablet 1    Sig: Take 1 tablet (5 mg total) by mouth daily.      Cardiovascular:  Antilipid - Statins Failed - 03/19/2020  9:31 AM      Failed - Total Cholesterol in normal range and within 360 days    Cholesterol, Total  Date Value Ref Range Status  02/08/2019 194 100 - 199 mg/dL Final          Failed - LDL in normal range and within 360 days    LDL Calculated  Date Value Ref Range Status  02/08/2019 125 (H) 0 - 99 mg/dL Final          Failed - HDL in normal range and within 360 days    HDL  Date Value Ref Range Status  02/08/2019 43 >39 mg/dL Final          Failed - Triglycerides in normal range and within 360 days    Triglycerides  Date Value Ref Range Status  02/08/2019 132 0 - 149 mg/dL  Final          Passed - Patient is not pregnant      Passed - Valid encounter within last 12 months    Recent Outpatient Visits           2 months ago Type 2 diabetes mellitus with other specified complication, without long-term current use of insulin ([place]  [place]   6 months ago [facility] with hormonal etiology [facility]  CrStedmanMegan P, DO   10 months ago Pain and swelling of left knee   [facility]   1 year ago Essential hypertension   CrDavyMaJeannette HowMD   1 year ago [place]

## 2020-03-19 NOTE — Telephone Encounter (Signed)
Medication Refill - Medication: metformin, rosuvastatin   Has the patient contacted their pharmacy? Yes.   (Agent: If no, request that the patient contact the pharmacy for the refill.) (Agent: If yes, when and what did the pharmacy advise?)  Preferred Pharmacy (with phone number or street name):  [facility] [address] ([address]), [place] - [address]  [address]) [place] [address]  Phone: [phone] Fax: [phone]  Not a 24 hour pharmacy; exact hours not known.     Agent: Please be advised that RX refills may take up to 3 business days. We ask that you follow-up with your pharmacy.

## 2020-04-08 ENCOUNTER — Telehealth: Payer: Self-pay | Admitting: Family Medicine

## 2020-04-08 ENCOUNTER — Other Ambulatory Visit: Payer: Self-pay

## 2020-04-08 DIAGNOSIS — Z0000 Encounter for general adult medical examination without abnormal findings: Secondary | ICD-10-CM

## 2020-04-08 DIAGNOSIS — I10 Essential (primary) hypertension: Secondary | ICD-10-CM

## 2020-04-08 DIAGNOSIS — E1169 Type 2 diabetes mellitus with other specified complication: Secondary | ICD-10-CM

## 2020-04-08 DIAGNOSIS — R351 Nocturia: Secondary | ICD-10-CM

## 2020-04-08 NOTE — Telephone Encounter (Signed)
Signed lab orders.  Saralyn Pilar, DO [facility] 04/08/2020, 8:38 AM

## 2020-04-09 LAB — COMPLETE METABOLIC PANEL WITH GFR
AG Ratio: 1.9 (calc) (ref 1.0–2.5)
ALT: 27 U/L (ref 9–46)
AST: 24 U/L (ref 10–35)
Albumin: 4.2 g/dL (ref 3.6–5.1)
Alkaline phosphatase (APISO): 78 U/L (ref 35–144)
BUN: 15 mg/dL (ref 7–25)
CO2: 27 mmol/L (ref 20–32)
Calcium: 9.5 mg/dL (ref 8.6–10.3)
Chloride: 103 mmol/L (ref 98–110)
Creat: 1.03 mg/dL (ref 0.70–1.25)
GFR, Est African American: 90 mL/min/{1.73_m2} (ref >=60)
GFR, Est Non African American: 78 mL/min/{1.73_m2} (ref >=60)
Globulin: 2.2 g/dL (calc) (ref 1.9–3.7)
Glucose, Bld: 148 mg/dL — ABNORMAL HIGH (ref 65–99)
Potassium: 4.3 mmol/L (ref 3.5–5.3)
Sodium: 138 mmol/L (ref 135–146)
Total Bilirubin: 0.6 mg/dL (ref 0.2–1.2)
Total Protein: 6.4 g/dL (ref 6.1–8.1)

## 2020-04-09 LAB — CBC WITH DIFFERENTIAL/PLATELET
Absolute Monocytes: 458 cells/uL (ref 200–950)
Basophils Absolute: 42 cells/uL (ref 0–200)
Basophils Relative: 0.8 %
Eosinophils Absolute: 239 cells/uL (ref 15–500)
Eosinophils Relative: 4.6 %
HCT: 42 % (ref 38.5–50.0)
Hemoglobin: 14 g/dL (ref 13.2–17.1)
Lymphs Abs: 1752 cells/uL (ref 850–3900)
MCH: 30.7 pg (ref 27.0–33.0)
MCHC: 33.3 g/dL (ref 32.0–36.0)
MCV: 92.1 fL (ref 80.0–100.0)
MPV: 8.8 fL (ref 7.5–12.5)
Monocytes Relative: 8.8 %
Neutro Abs: 2709 cells/uL (ref 1500–7800)
Neutrophils Relative %: 52.1 %
Platelets: 226 10*3/uL (ref 140–400)
RBC: 4.56 10*6/uL (ref 4.20–5.80)
RDW: 13.1 % (ref 11.0–15.0)
Total Lymphocyte: 33.7 %
WBC: 5.2 10*3/uL (ref 3.8–10.8)

## 2020-04-09 LAB — LIPID PANEL
Cholesterol: 106 mg/dL (ref <200)
HDL: 53 mg/dL (ref >=40)
LDL Cholesterol (Calc): 36 mg/dL (calc)
Non-HDL Cholesterol (Calc): 53 mg/dL (calc) (ref <130)
Total CHOL/HDL Ratio: 2 (calc) (ref <5.0)
Triglycerides: 90 mg/dL (ref <150)

## 2020-04-09 LAB — HEMOGLOBIN A1C
Hgb A1c MFr Bld: 6.3 % of total Hgb — ABNORMAL HIGH (ref <5.7)
Mean Plasma Glucose: 134 (calc)
eAG (mmol/L): 7.4 (calc)

## 2020-04-09 LAB — TSH: TSH: 2.25 mIU/L (ref 0.40–4.50)

## 2020-04-09 LAB — PSA: PSA: 0.7 ng/mL (ref <=4.0)

## 2020-04-15 ENCOUNTER — Ambulatory Visit (INDEPENDENT_AMBULATORY_CARE_PROVIDER_SITE_OTHER): Payer: 59 | Admitting: Family Medicine

## 2020-04-15 ENCOUNTER — Other Ambulatory Visit: Payer: Self-pay

## 2020-04-15 ENCOUNTER — Encounter: Payer: Self-pay | Admitting: Family Medicine

## 2020-04-15 VITALS — BP 111/66 | HR 81 | Resp 16 | Ht 74.0 in | Wt 215.6 lb

## 2020-04-15 DIAGNOSIS — E1169 Type 2 diabetes mellitus with other specified complication: Secondary | ICD-10-CM | POA: Diagnosis not present

## 2020-04-15 DIAGNOSIS — E785 Hyperlipidemia, unspecified: Secondary | ICD-10-CM | POA: Diagnosis not present

## 2020-04-15 DIAGNOSIS — I10 Essential (primary) hypertension: Secondary | ICD-10-CM

## 2020-04-15 DIAGNOSIS — Z0000 Encounter for general adult medical examination without abnormal findings: Secondary | ICD-10-CM

## 2020-04-15 MED ORDER — RYBELSUS 3 MG PO TABS: 3.0000 mg | ORAL_TABLET | Freq: Every day | ORAL | 0 refills | Status: DC
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
## 2020-04-15 NOTE — Assessment & Plan Note (Signed)
Controlled cholesterol on statin / lifestyle Last lipid panel 03/2020  Plan: 1. Continue current meds - Rosuvastatin 5mg nightly - re order 2. Continue ASA 81mg for primary ASCVD risk reduction 3. Encourage improved lifestyle - low carb/cholesterol, reduce portion size, continue improving regular exercise 

## 2020-04-15 NOTE — Assessment & Plan Note (Signed)
Well-controlled HTN Previously on [facility] study for BP Occasional recent elevated readings No known complications     Plan:  1. Continue current BP regimen Benazepril 40mg  daily, Metoprolol XL 25mg  daily 2. Encourage improved lifestyle - low sodium diet, regular exercise 3. Continue monitor BP outside office, bring readings to next visit, if persistently >140/90 or new symptoms notify office sooner  May reduce BP medication doses with overall improved health and wt loss on new med May determine if still need BB in future for HTN

## 2020-04-15 NOTE — Patient Instructions (Addendum)
Thank you for coming to the office today.  Please send me a copy of our Colon Testing results from your home records - could use the Colonoscopy from Dr Mechele Collin and also the Insurance FIT test home result.  Likely colonoscopy or other testing in 1-2 years, once I get these results and can review it, will discuss NEXT year.  Trial on Rybelsus 3mg  take 30 min before any food/drink/medicines. - before 30 days are up, let me know on mychart message or phone call - if you want me to order the Rybelsus 7mg  dose - that would continue for at least 3 months before we discuss changes, unless you feel truly do not need as high dose of Metformin may reduce it a little on your own.  If you want to bring BP cuff into office we can calibrate it next time.    Please schedule a Follow-up Appointment to: Return in about 4 months (around 08/15/2020) for 4 month DM A1c, med adjust.  If you have any other questions or concerns, please feel free to call the office or send a message through MyChart. You may also schedule an earlier appointment if necessary.  Additionally, you may be receiving a survey about your experience at our office within a few days to 1 week by e-mail or mail. We value your feedback.  , DO [facility], Saralyn Pilar

## 2020-04-15 NOTE — Assessment & Plan Note (Signed)
Well controlled T2DM A1c to 6.3 now on metformin monotherapy Goal to improve lifestyle Complicated w/ HLD, HTN - increase cardiovascular risk complications  Plan:  1. START Rybelsus 3mg  daily before meal/meds 30 min - 30 day 0 refills, notify when ready for dose inc to Rybelsus 7mg , review benefit risk side effect, coupon copay card given, will likely need PA completed. 2. Encourage improved lifestyle - low carb, low sugar diet, reduce portion size, continue improving regular exercise 3. Check CBG , bring log to next visit for review 4. Continue ACEi , Statin 5. UTD DM Foot. Request record from [facility] - done 02/2020, requested via fax today  Return 3 months, adjust Rybelsus

## 2020-05-05 IMAGING — US VENOUS DOPPLER ULTRASOUND OF LEFT LOWER EXTREMITY
1 series · 13 of 24 positions shown · non-contrast
Comparison: None.

CLINICAL DATA: 58-year-old male with left lower extremity edema x2
days.



[Series 1: venous doppler ultrasound of left lower extremity · 13 of 95 slices shown]
[im 1/95]
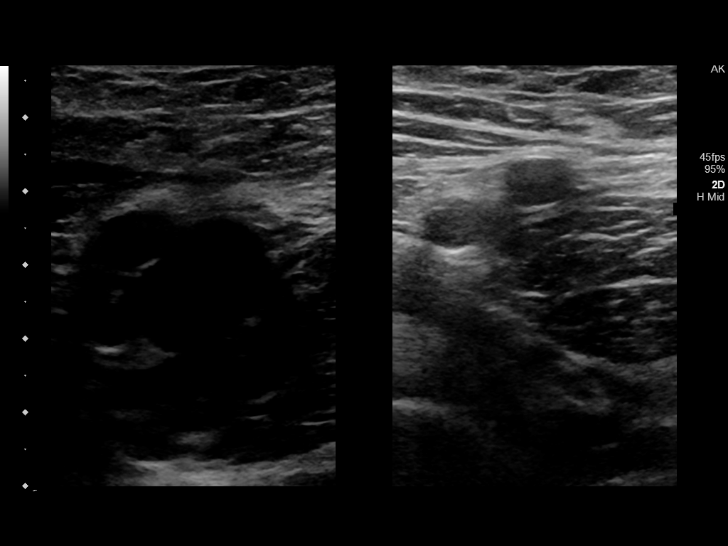
[im 9/95]
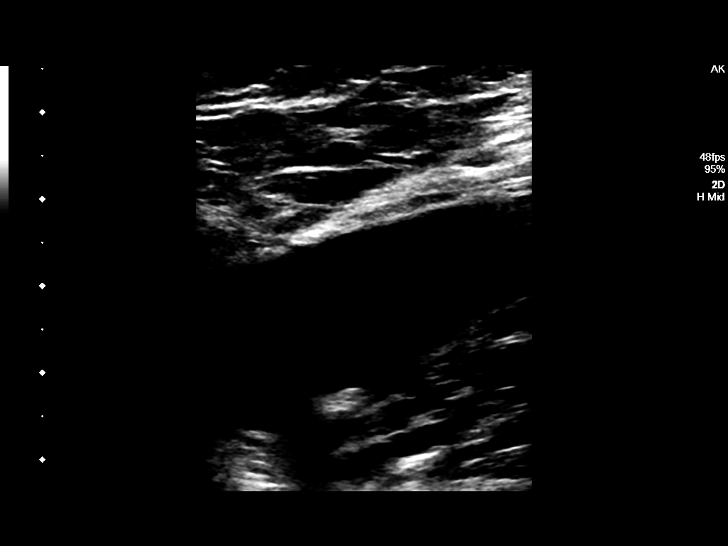
[im 17/95]
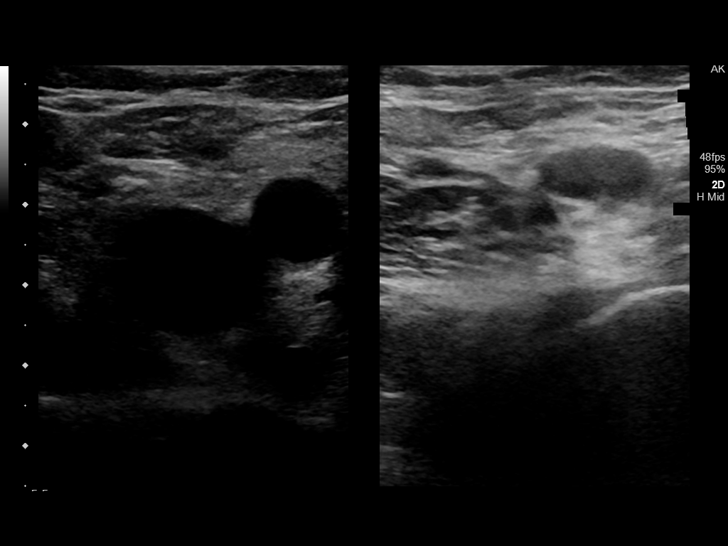
[im 25/95]
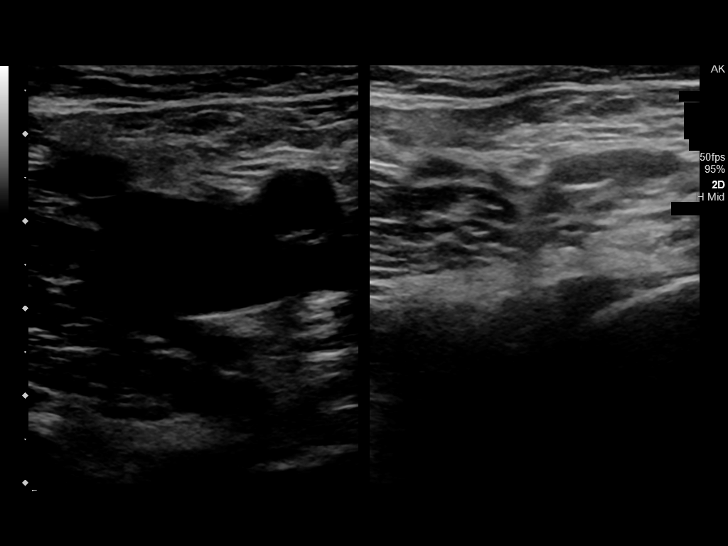
[im 33/95]
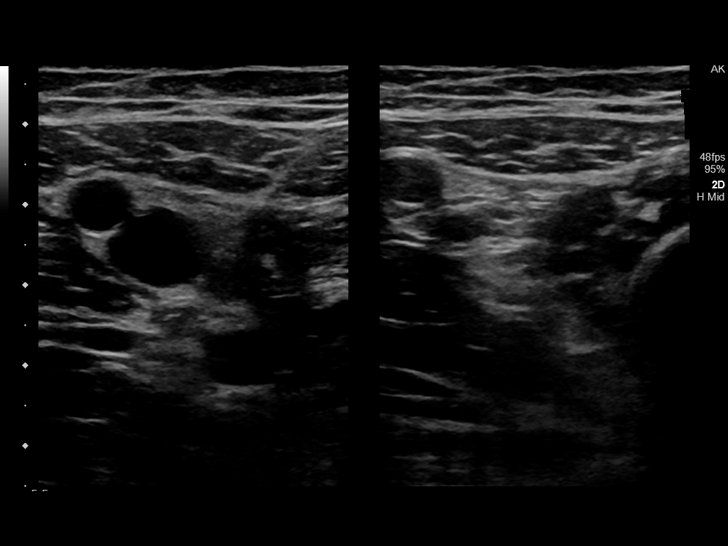
[im 41/95]
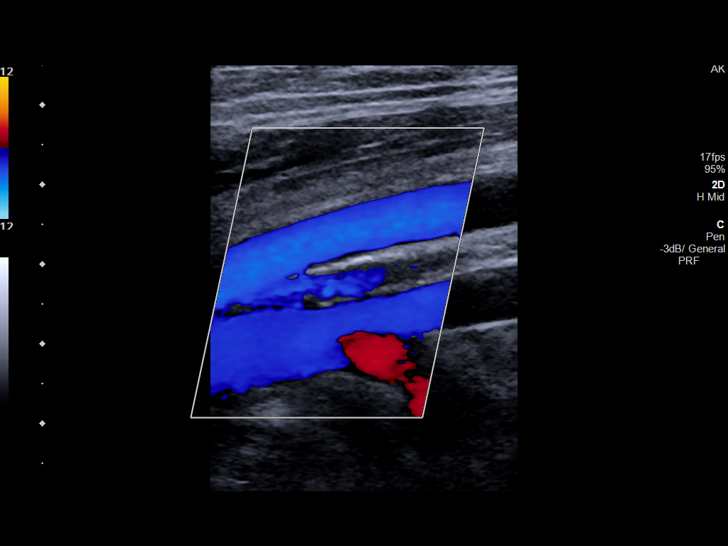
[im 50/95]
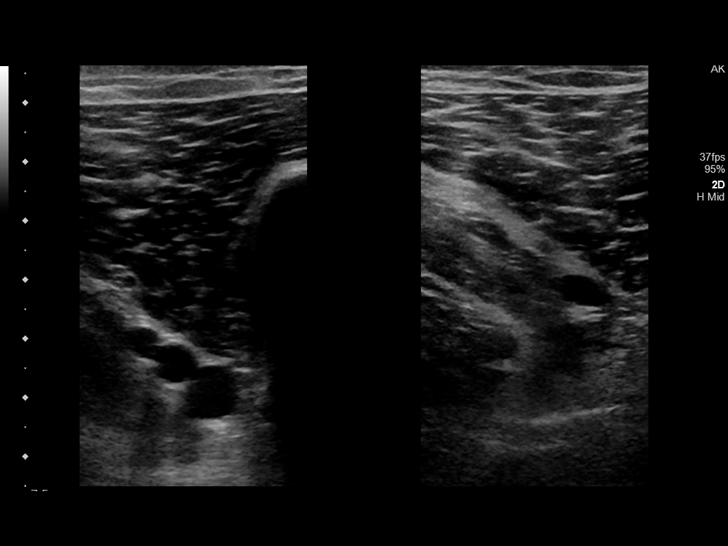
[im 54/95]
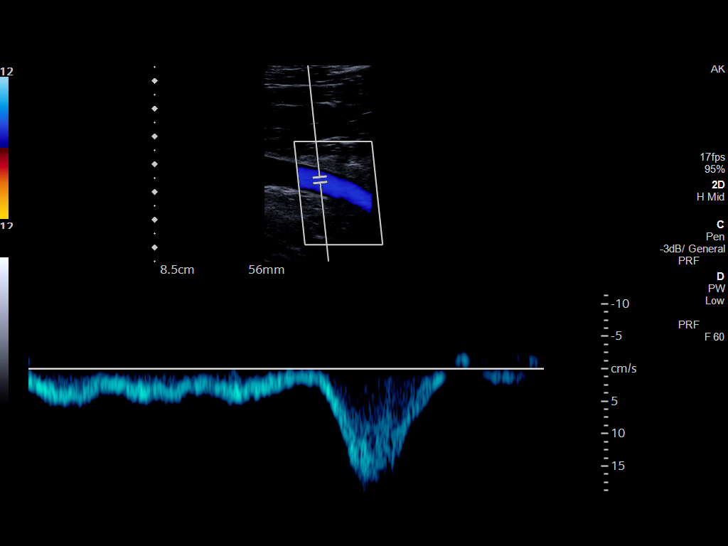
[im 62/95]
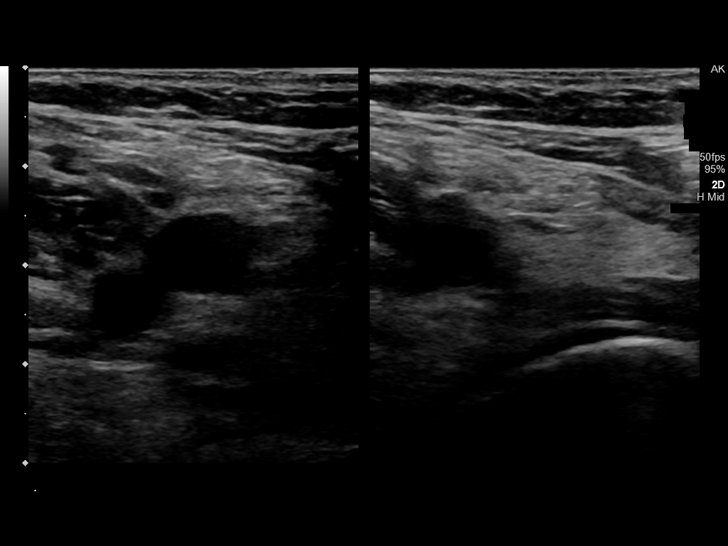
[im 70/95]
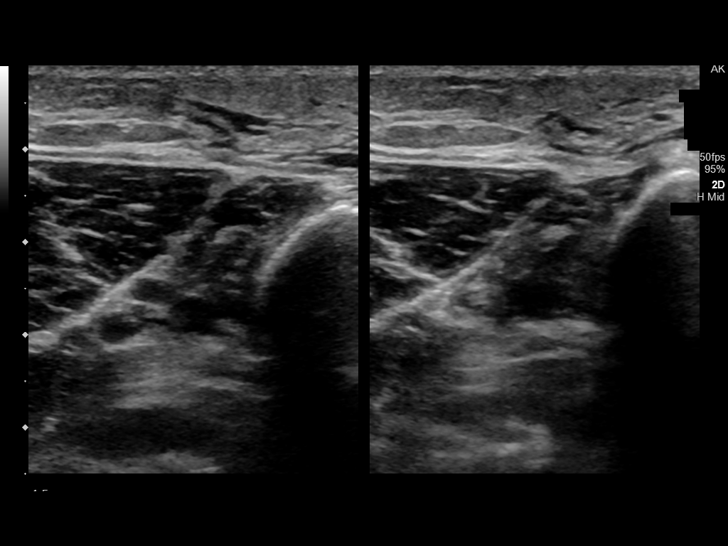
[im 78/95]
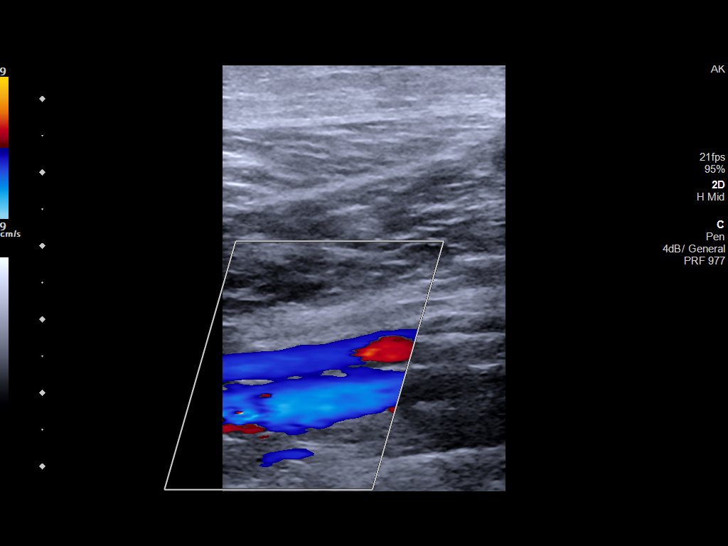
[im 86/95]
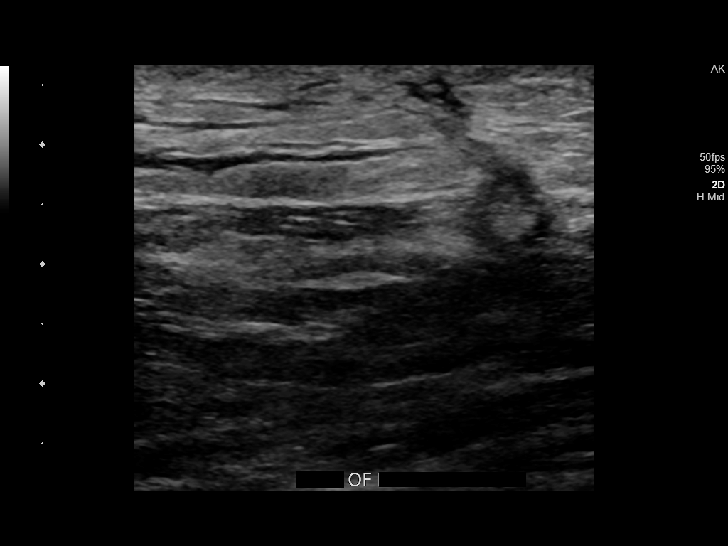
[im 95/95]
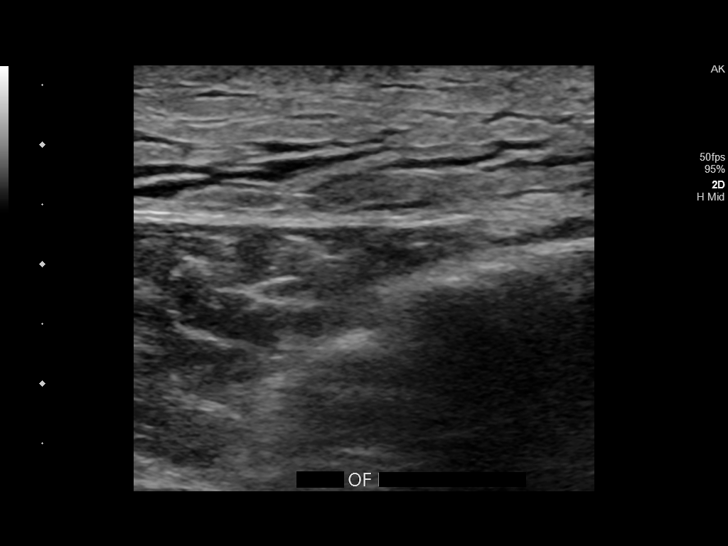

[13 of 24 positions shown; findings below may reference images not displayed]

FINDINGS: Contralateral Common Femoral Vein: Respiratory phasicity is normal
and symmetric with the symptomatic side. No evidence of thrombus.
Normal compressibility.

Common Femoral Vein: No evidence of thrombus. Normal
compressibility, respiratory phasicity and response to augmentation.

Saphenofemoral Junction: No evidence of thrombus. Normal
compressibility and flow on color Doppler imaging.

Profunda Femoral Vein: No evidence of thrombus. Normal
compressibility and flow on color Doppler imaging.

Femoral Vein: No evidence of thrombus. Normal compressibility,
respiratory phasicity and response to augmentation.

Popliteal Vein: No evidence of thrombus. Normal compressibility,
respiratory phasicity and response to augmentation.

Calf Veins: No evidence of thrombus. Normal compressibility and flow
on color Doppler imaging.

Superficial Great Saphenous Vein: No evidence of thrombus. Normal
compressibility.

Venous Reflux:  None.

Other Findings: There is subcutaneous soft tissue edema over the
area of redness. Clinical correlation is recommended to evaluate for
cellulitis.
IMPRESSION: No evidence of deep venous thrombosis.

## 2020-05-20 ENCOUNTER — Other Ambulatory Visit: Payer: Self-pay

## 2020-05-20 DIAGNOSIS — E1169 Type 2 diabetes mellitus with other specified complication: Secondary | ICD-10-CM

## 2020-05-20 MED ORDER — RYBELSUS 7 MG PO TABS: 7.0000 mg | ORAL_TABLET | Freq: Every day | ORAL | 2 refills | Status: DC

## 2020-05-21 DIAGNOSIS — I10 Essential (primary) hypertension: Secondary | ICD-10-CM

## 2020-06-13 MED ORDER — METOPROLOL SUCCINATE ER 25 MG PO TB24: 25.0000 mg | ORAL_TABLET | Freq: Every day | ORAL | 1 refills | Status: DC

## 2020-06-13 MED ORDER — BENAZEPRIL HCL 40 MG PO TABS: 40.0000 mg | ORAL_TABLET | Freq: Every day | ORAL | 1 refills | Status: DC

## 2020-06-13 NOTE — Addendum Note (Signed)
Addended by: Smitty Cords on: 06/13/2020 03:44 PM   Modules accepted: Orders

## 2020-07-31 ENCOUNTER — Other Ambulatory Visit: Payer: Self-pay | Admitting: Family Medicine

## 2020-07-31 DIAGNOSIS — E1169 Type 2 diabetes mellitus with other specified complication: Secondary | ICD-10-CM

## 2020-07-31 MED ORDER — METFORMIN HCL 500 MG PO TABS: 1000.0000 mg | ORAL_TABLET | Freq: Two times a day (BID) | ORAL | 3 refills | Status: DC

## 2020-08-17 ENCOUNTER — Other Ambulatory Visit: Payer: Self-pay | Admitting: Family Medicine

## 2020-08-17 DIAGNOSIS — E1169 Type 2 diabetes mellitus with other specified complication: Secondary | ICD-10-CM

## 2020-08-17 NOTE — Telephone Encounter (Signed)
Requested medication (s) are due for refill today: yes  Requested medication (s) are on the active medication list: yes  Last refill:  05/20/20  Future visit scheduled: yes  Notes to clinic:  med not assigned to a protocol   Requested Prescriptions  Pending Prescriptions Disp Refills   RYBELSUS 7 MG TABS [Pharmacy Med Name: Rybelsus 7 MG Oral Tablet] 30 tablet 0    Sig: TAKE 1 TABLET BY MOUTH ONCE DAILY BEFORE BREAKFAST 30 MINUTES PRIOR TO MEAL WITHOUT ANY OTHER MEDICINES AT SAME TIME      Off-Protocol Failed - 08/17/2020 11:00 AM      Failed - Medication not assigned to a protocol, review manually.      Passed - Valid encounter within last 12 months    Recent Outpatient Visits           4 months ago Annual physical exam   [facility], Netta Neat, DO   7 months ago Type 2 diabetes mellitus with other specified complication, without long-term current use of insulin [facility])   [facility], Netta Neat, DO   11 months ago Diabetes mellitus associated with hormonal etiology [facility])   [facility] [place], Megan P, DO   1 year ago Pain and swelling of left knee   [facility] Marjie Skiff, NP   1 year ago Essential hypertension   [facility] [place], Redge Gainer, MD       Future Appointments             In 1 month Karamalegos, Netta Neat, DO [facility], [facility]

## 2020-08-19 ENCOUNTER — Ambulatory Visit: Payer: 59 | Admitting: Family Medicine

## 2020-08-19 MED ORDER — RYBELSUS 7 MG PO TABS: 7.0000 mg | ORAL_TABLET | Freq: Every day | ORAL | 0 refills | Status: DC

## 2020-08-19 NOTE — Telephone Encounter (Signed)
Not our patient

## 2020-08-19 NOTE — Telephone Encounter (Signed)
Rerouting to [facility]

## 2020-08-27 ENCOUNTER — Ambulatory Visit: Payer: 59 | Admitting: Family Medicine

## 2020-09-16 ENCOUNTER — Other Ambulatory Visit: Payer: Self-pay | Admitting: Family Medicine

## 2020-09-16 ENCOUNTER — Other Ambulatory Visit: Payer: Self-pay

## 2020-09-16 ENCOUNTER — Encounter: Payer: Self-pay | Admitting: Family Medicine

## 2020-09-16 ENCOUNTER — Ambulatory Visit (INDEPENDENT_AMBULATORY_CARE_PROVIDER_SITE_OTHER): Payer: 59 | Admitting: Family Medicine

## 2020-09-16 VITALS — BP 129/72 | HR 86 | Temp 97.4°F | Resp 16 | Ht 74.0 in | Wt 218.6 lb

## 2020-09-16 DIAGNOSIS — G44329 Chronic post-traumatic headache, not intractable: Secondary | ICD-10-CM | POA: Diagnosis not present

## 2020-09-16 DIAGNOSIS — I10 Essential (primary) hypertension: Secondary | ICD-10-CM | POA: Diagnosis not present

## 2020-09-16 DIAGNOSIS — E785 Hyperlipidemia, unspecified: Secondary | ICD-10-CM

## 2020-09-16 DIAGNOSIS — E1169 Type 2 diabetes mellitus with other specified complication: Secondary | ICD-10-CM

## 2020-09-16 DIAGNOSIS — Z0000 Encounter for general adult medical examination without abnormal findings: Secondary | ICD-10-CM

## 2020-09-16 DIAGNOSIS — R351 Nocturia: Secondary | ICD-10-CM

## 2020-09-16 DIAGNOSIS — R413 Other amnesia: Secondary | ICD-10-CM

## 2020-09-16 LAB — POCT GLYCOSYLATED HEMOGLOBIN (HGB A1C): Hemoglobin A1C: 6.4 % — AB (ref 4.0–5.6)

## 2020-09-16 MED ORDER — ROSUVASTATIN CALCIUM 5 MG PO TABS: 5.0000 mg | ORAL_TABLET | Freq: Every day | ORAL | 3 refills | Status: DC

## 2020-09-16 MED ORDER — BENAZEPRIL HCL 40 MG PO TABS: 40.0000 mg | ORAL_TABLET | Freq: Every day | ORAL | 3 refills | Status: DC

## 2020-09-16 MED ORDER — METOPROLOL SUCCINATE ER 25 MG PO TB24: 25.0000 mg | ORAL_TABLET | Freq: Every day | ORAL | 3 refills | Status: DC

## 2020-09-16 MED ORDER — RYBELSUS 7 MG PO TABS: 7.0000 mg | ORAL_TABLET | Freq: Every day | ORAL | 1 refills | Status: DC

## 2020-09-16 NOTE — Assessment & Plan Note (Signed)
Well controlled T2DM A1c 6.4, stable now OFF metformin and on Rybelsus GLP1 Goal to improve lifestyle Complicated w/ HLD, HTN - increase cardiovascular risk complications  Plan:  1. Refill Rybelsus 7mg  daily - order 90 day 2. Encourage improved lifestyle - low carb, low sugar diet, reduce portion size, continue improving regular exercise 3. Check CBG , bring log to next visit for review 4. Continue ACEi , Statin

## 2020-09-16 NOTE — Patient Instructions (Addendum)
Thank you for coming to the office today.  Call them to schedule.  [facility] - Neurology Dept [address] [place], [place] [address] Phone: [phone]  Last visit with Dr [name] 11/2017   Recent Labs    01/11/20 1056 04/08/20 0847 09/16/20 1013  HGBA1C 6.6* 6.3* 6.4*    Refilled Rybelsus 7mg  daily as you are taking. No change.   Refilled all other meds.  DUE for FASTING BLOOD WORK (no food or drink after midnight before the lab appointment, only water or coffee without cream/sugar on the morning of)  SCHEDULE "Lab Only" visit in the morning at the clinic for lab draw in 7 MONTHS   - Make sure Lab Only appointment is at about 1 week before your next appointment, so that results will be available  For Lab Results, once available within 2-3 days of blood draw, you can can log in to MyChart online to view your results and a brief explanation. Also, we can discuss results at next follow-up visit.   Please schedule a Follow-up Appointment to: Return in about 7 months (around 04/16/2021) for 7 month fasting lab only then 1 week later Annual Physical.  If you have any other questions or concerns, please feel free to call the office or send a message through MyChart. You may also schedule an earlier appointment if necessary.  Additionally, you may be receiving a survey about your experience at our office within a few days to 1 week by e-mail or mail. We value your feedback.  04/18/2021, DO [facility], [facility]

## 2020-09-16 NOTE — Assessment & Plan Note (Signed)
Controlled cholesterol on statin / lifestyle Last lipid panel 03/2020  Plan: 1. Continue current meds - Rosuvastatin 5mg  nightly - re order 2. Continue ASA 81mg  for primary ASCVD risk reduction 3. Encourage improved lifestyle - low carb/cholesterol, reduce portion size, continue improving regular exercise

## 2020-09-16 NOTE — Assessment & Plan Note (Signed)
Well-controlled HTN Previously on [facility] study for BP Occasional recent elevated readings No known complications     Plan:  1. Continue current BP regimen Benazepril 40mg  daily, Metoprolol XL 25mg  daily 2. Encourage improved lifestyle - low sodium diet, regular exercise 3. Continue monitor BP outside office, bring readings to next visit, if persistently >140/90 or new symptoms notify office sooner  May reduce BP medication doses with overall improved health and wt loss on new med May determine if still need BB in future for HTN - again discussed, he can try adjusting BP med at home can try HALF of Benazepril or Metoprolol and see how he does.
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
## 2021-01-28 ENCOUNTER — Other Ambulatory Visit: Payer: Self-pay | Admitting: Family Medicine

## 2021-01-28 DIAGNOSIS — I10 Essential (primary) hypertension: Secondary | ICD-10-CM

## 2021-01-28 MED ORDER — METOPROLOL SUCCINATE ER 25 MG PO TB24: 25.0000 mg | ORAL_TABLET | Freq: Every day | ORAL | 0 refills | Status: DC

## 2021-01-28 NOTE — Telephone Encounter (Signed)
Medication Refill - Medication: Metoprolol Succinate 25 mg  Has the patient contacted their pharmacy? Yes.   Pt states that he is needing to have a new prescription according to pharmacy. Pt states that he has been out of this medication x1 month. Please advise.  (Agent: If no, request that the patient contact the pharmacy for the refill.) (Agent: If yes, when and what did the pharmacy advise?)  Preferred Pharmacy (with phone number or street name):  [facility] [address]), [place] - [address]  [address]) [place] [address]  Phone: [phone] Fax: [phone]  Hours: Not open 24 hours     Agent: Please be advised that RX refills may take up to 3 business days. We ask that you follow-up with your pharmacy.

## 2021-02-04 ENCOUNTER — Other Ambulatory Visit: Payer: Self-pay

## 2021-02-04 ENCOUNTER — Other Ambulatory Visit: Payer: Self-pay | Admitting: Family Medicine

## 2021-02-04 DIAGNOSIS — I10 Essential (primary) hypertension: Secondary | ICD-10-CM

## 2021-02-04 MED ORDER — BENAZEPRIL HCL 40 MG PO TABS: 40.0000 mg | ORAL_TABLET | Freq: Every day | ORAL | 3 refills | Status: DC

## 2021-03-18 ENCOUNTER — Other Ambulatory Visit: Payer: Self-pay

## 2021-03-18 ENCOUNTER — Other Ambulatory Visit: Payer: Self-pay | Admitting: Family Medicine

## 2021-03-18 DIAGNOSIS — E1169 Type 2 diabetes mellitus with other specified complication: Secondary | ICD-10-CM

## 2021-03-18 MED ORDER — RYBELSUS 7 MG PO TABS: 7.0000 mg | ORAL_TABLET | Freq: Every day | ORAL | 1 refills | Status: DC

## 2021-03-18 NOTE — Telephone Encounter (Signed)
Requested medication (s) are due for refill today: yes  Requested medication (s) are on the active medication list: yes  Last refill: 02/05/21  Future visit scheduled: yes  Notes to clinic: no assigned protocol    Requested Prescriptions  Pending Prescriptions Disp Refills   RYBELSUS 7 MG TABS [Pharmacy Med Name: Rybelsus 7 MG Oral Tablet] 90 tablet 0    Sig: TAKE 1 TABLET BY MOUTH ONCE DAILY BEFORE BREAKFAST. 30 MINUTES PRIOR TO MEAL WITHOUT ANY OTHER MEDICINES AT THE SAME TIME      Off-Protocol Failed - 03/18/2021  7:29 AM      Failed - Medication not assigned to a protocol, review manually.      Passed - Valid encounter within last 12 months    Recent Outpatient Visits           6 months ago Type 2 diabetes mellitus with other specified complication, without long-term current use of insulin [facility])   [facility] Smitty Cords, DO   11 months ago Annual physical exam   [facility] Smitty Cords, DO   1 year ago Type 2 diabetes mellitus with other specified complication, without long-term current use of insulin [facility])   [facility] Smitty Cords, DO   1 year ago Diabetes mellitus associated with hormonal etiology [facility])   [facility] Dewey, Megan P, DO   1 year ago Pain and swelling of left knee   [facility] Marjie Skiff, NP       Future Appointments             In 4 weeks Althea Charon, Netta Neat, DO [facility]

## 2021-04-07 ENCOUNTER — Other Ambulatory Visit: Payer: 59

## 2021-04-08 LAB — COMPLETE METABOLIC PANEL WITH GFR
AG Ratio: 1.8 (calc) (ref 1.0–2.5)
ALT: 20 U/L (ref 9–46)
AST: 16 U/L (ref 10–35)
Albumin: 4.4 g/dL (ref 3.6–5.1)
Alkaline phosphatase (APISO): 83 U/L (ref 35–144)
BUN: 18 mg/dL (ref 7–25)
CO2: 28 mmol/L (ref 20–32)
Calcium: 9.6 mg/dL (ref 8.6–10.3)
Chloride: 102 mmol/L (ref 98–110)
Creat: 1.07 mg/dL (ref 0.70–1.25)
GFR, Est African American: 86 mL/min/{1.73_m2} (ref >=60)
GFR, Est Non African American: 74 mL/min/{1.73_m2} (ref >=60)
Globulin: 2.4 g/dL (calc) (ref 1.9–3.7)
Glucose, Bld: 151 mg/dL — ABNORMAL HIGH (ref 65–99)
Potassium: 4.6 mmol/L (ref 3.5–5.3)
Sodium: 138 mmol/L (ref 135–146)
Total Bilirubin: 0.6 mg/dL (ref 0.2–1.2)
Total Protein: 6.8 g/dL (ref 6.1–8.1)

## 2021-04-08 LAB — HEMOGLOBIN A1C
Hgb A1c MFr Bld: 7.3 % of total Hgb — ABNORMAL HIGH (ref <5.7)
Mean Plasma Glucose: 163 mg/dL
eAG (mmol/L): 9 mmol/L

## 2021-04-08 LAB — CBC WITH DIFFERENTIAL/PLATELET
Absolute Monocytes: 666 cells/uL (ref 200–950)
Basophils Absolute: 22 cells/uL (ref 0–200)
Basophils Relative: 0.4 %
Eosinophils Absolute: 78 cells/uL (ref 15–500)
Eosinophils Relative: 1.4 %
HCT: 47.2 % (ref 38.5–50.0)
Hemoglobin: 15.3 g/dL (ref 13.2–17.1)
Lymphs Abs: 1876 cells/uL (ref 850–3900)
MCH: 29.6 pg (ref 27.0–33.0)
MCHC: 32.4 g/dL (ref 32.0–36.0)
MCV: 91.3 fL (ref 80.0–100.0)
MPV: 9.1 fL (ref 7.5–12.5)
Monocytes Relative: 11.9 %
Neutro Abs: 2957 cells/uL (ref 1500–7800)
Neutrophils Relative %: 52.8 %
Platelets: 262 10*3/uL (ref 140–400)
RBC: 5.17 10*6/uL (ref 4.20–5.80)
RDW: 12.8 % (ref 11.0–15.0)
Total Lymphocyte: 33.5 %
WBC: 5.6 10*3/uL (ref 3.8–10.8)

## 2021-04-08 LAB — PSA: PSA: 0.67 ng/mL (ref <=4.00)

## 2021-04-08 LAB — LIPID PANEL
Cholesterol: 124 mg/dL (ref <200)
HDL: 42 mg/dL (ref >=40)
LDL Cholesterol (Calc): 62 mg/dL (calc)
Non-HDL Cholesterol (Calc): 82 mg/dL (calc) (ref <130)
Total CHOL/HDL Ratio: 3 (calc) (ref <5.0)
Triglycerides: 114 mg/dL (ref <150)

## 2021-04-14 ENCOUNTER — Encounter: Payer: 59 | Admitting: Family Medicine

## 2021-04-15 ENCOUNTER — Encounter: Payer: Self-pay | Admitting: Family Medicine

## 2021-04-15 ENCOUNTER — Other Ambulatory Visit: Payer: Self-pay

## 2021-04-15 ENCOUNTER — Ambulatory Visit (INDEPENDENT_AMBULATORY_CARE_PROVIDER_SITE_OTHER): Payer: 59 | Admitting: Family Medicine

## 2021-04-15 VITALS — BP 129/72 | HR 85 | Ht 72.5 in | Wt 218.4 lb

## 2021-04-15 DIAGNOSIS — Z0000 Encounter for general adult medical examination without abnormal findings: Secondary | ICD-10-CM | POA: Diagnosis not present

## 2021-04-15 DIAGNOSIS — B351 Tinea unguium: Secondary | ICD-10-CM

## 2021-04-15 DIAGNOSIS — E1169 Type 2 diabetes mellitus with other specified complication: Secondary | ICD-10-CM

## 2021-04-15 DIAGNOSIS — I10 Essential (primary) hypertension: Secondary | ICD-10-CM

## 2021-04-15 MED ORDER — TERBINAFINE HCL 250 MG PO TABS: 250.0000 mg | ORAL_TABLET | Freq: Every day | ORAL | 2 refills | Status: DC

## 2021-04-15 MED ORDER — METOPROLOL SUCCINATE ER 25 MG PO TB24: 25.0000 mg | ORAL_TABLET | Freq: Every day | ORAL | 3 refills | Status: DC

## 2021-04-15 MED ORDER — RYBELSUS 14 MG PO TABS: ORAL_TABLET | ORAL | 1 refills | Status: DC
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
## 2021-04-15 NOTE — Assessment & Plan Note (Signed)
Well-controlled HTN Previously on [facility] Dental study for BP Occasional recent elevated readings No known complications     Plan:  1. Continue current BP regimen Benazepril 40mg  daily, Metoprolol XL 25mg  daily 2. Encourage improved lifestyle - low sodium diet, regular exercise 3. Continue monitor BP outside office, bring readings to next visit, if persistently >140/90 or new symptoms notify office sooner
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
## 2021-04-15 NOTE — Assessment & Plan Note (Signed)
Well controlled T2DM but now A1c up to 7.3 Goal to improve lifestyle Complicated w/ HLD, HTN - increase cardiovascular risk complications Off Metformin  Plan:  1. Dose inc Rybelsus from 7 to 14mg  new rx sent. 2. Encourage improved lifestyle - low carb, low sugar diet, reduce portion size, continue improving regular exercise 3. Check CBG , bring log to next visit for review 4. Continue ACEi , Statin

## 2021-05-19 ENCOUNTER — Other Ambulatory Visit: Payer: Self-pay

## 2021-05-19 ENCOUNTER — Encounter: Payer: Self-pay | Admitting: Family Medicine

## 2021-05-19 DIAGNOSIS — B351 Tinea unguium: Secondary | ICD-10-CM

## 2021-05-19 MED ORDER — TERBINAFINE HCL 250 MG PO TABS: 250.0000 mg | ORAL_TABLET | Freq: Every day | ORAL | 2 refills | Status: DC

## 2021-06-25 ENCOUNTER — Encounter: Payer: Self-pay | Admitting: Family Medicine

## 2021-06-25 ENCOUNTER — Other Ambulatory Visit: Payer: Self-pay

## 2021-06-25 DIAGNOSIS — B351 Tinea unguium: Secondary | ICD-10-CM

## 2021-06-25 MED ORDER — TERBINAFINE HCL 250 MG PO TABS: 250.0000 mg | ORAL_TABLET | Freq: Every day | ORAL | 2 refills | Status: DC

## 2021-07-09 ENCOUNTER — Other Ambulatory Visit: Payer: Self-pay

## 2021-07-09 DIAGNOSIS — B351 Tinea unguium: Secondary | ICD-10-CM

## 2021-07-09 MED ORDER — TERBINAFINE HCL 250 MG PO TABS: 250.0000 mg | ORAL_TABLET | Freq: Every day | ORAL | 3 refills | Status: DC

## 2021-11-03 ENCOUNTER — Other Ambulatory Visit: Payer: Self-pay | Admitting: Family Medicine

## 2021-11-03 DIAGNOSIS — E1169 Type 2 diabetes mellitus with other specified complication: Secondary | ICD-10-CM

## 2021-11-03 NOTE — Telephone Encounter (Signed)
Requested medication (s) are due for refill today Yes.  Last ordered 04/15/21  #90 with one refill  Requested medication (s) are on the active medication list Yes  Future visit scheduled Yes for 01/20/22  Note to clinic-Medication not assigned a protocol. Routing to physician for approval.    Requested Prescriptions  Pending Prescriptions Disp Refills   RYBELSUS 14 MG TABS [Pharmacy Med Name: Rybelsus 14 MG Oral Tablet] 90 tablet 0    Sig: TAKE 1 TABLET BY MOUTH ONCE DAILY 30 MINUTES BEFORE BREAKFAST AND WITHOUT ANY OTHER MEDICINES     Off-Protocol Failed - 11/03/2021  3:39 PM      Failed - Medication not assigned to a protocol, review manually.      Passed - Valid encounter within last 12 months    Recent Outpatient Visits           6 months ago Annual physical exam   [facility] Olin Hauser, DO   1 year ago Type 2 diabetes mellitus with other specified complication, without long-term current use of insulin [facility])   [place], DO   1 year ago Annual physical exam   [facility] Olin Hauser, DO   1 year ago Type 2 diabetes mellitus with other specified complication, without long-term current use of insulin [facility])   [facility], Devonne Doughty, DO   2 years ago Diabetes mellitus associated with hormonal etiology [facility])   [place], Megan P, DO

## 2021-11-04 ENCOUNTER — Encounter: Payer: Self-pay | Admitting: Family Medicine

## 2021-11-04 NOTE — Telephone Encounter (Signed)
Please review.  KP

## 2021-12-08 ENCOUNTER — Encounter: Payer: Self-pay | Admitting: Family Medicine

## 2021-12-08 DIAGNOSIS — E1169 Type 2 diabetes mellitus with other specified complication: Secondary | ICD-10-CM

## 2021-12-08 MED ORDER — METFORMIN HCL 500 MG PO TABS: 1000.0000 mg | ORAL_TABLET | Freq: Two times a day (BID) | ORAL | 3 refills | Status: DC

## 2022-02-10 ENCOUNTER — Other Ambulatory Visit: Payer: Self-pay | Admitting: Family Medicine

## 2022-02-10 ENCOUNTER — Encounter: Payer: Self-pay | Admitting: Family Medicine

## 2022-02-10 DIAGNOSIS — I10 Essential (primary) hypertension: Secondary | ICD-10-CM

## 2022-05-08 ENCOUNTER — Other Ambulatory Visit: Payer: Self-pay | Admitting: Family Medicine

## 2022-05-08 DIAGNOSIS — I10 Essential (primary) hypertension: Secondary | ICD-10-CM

## 2022-05-08 NOTE — Telephone Encounter (Signed)
Requested medication (s) are due for refill today: yes  Requested medication (s) are on the active medication list: yes  Last refill:  04/15/21 #90/3  Future visit scheduled: no  Notes to clinic:  Unable to refill per protocol, appointment needed, LVMTCB     Requested Prescriptions  Pending Prescriptions Disp Refills   metoprolol succinate (TOPROL-XL) 25 MG 24 hr tablet [Pharmacy Med Name: Metoprolol Succinate ER 25 MG Oral Tablet Extended Release 24 Hour] 30 tablet 0    Sig: Take 1 tablet by mouth once daily     Cardiovascular:  Beta Blockers Failed - 05/08/2022 12:30 PM      Failed - Valid encounter within last 6 months    Recent Outpatient Visits           1 year ago Annual physical exam   [facility] Smitty Cords, DO   1 year ago Type 2 diabetes mellitus with other specified complication, without long-term current use of insulin (HCC)   [facility] Charon, Netta Neat, DO   2 years ago Annual physical exam   [facility] [place], Netta Neat, DO   2 years ago Type 2 diabetes mellitus with other specified complication, without long-term current use of insulin [facility])   [facility] [place], Netta Neat, DO   2 years ago Diabetes mellitus associated with hormonal etiology [facility])   [facility] Johnson, Megan P, DO              Passed - Last BP in normal range    BP Readings from Last 1 Encounters:  04/15/21 129/72         Passed - Last Heart Rate in normal range    Pulse Readings from Last 1 Encounters:  04/15/21 85

## 2022-05-08 NOTE — Telephone Encounter (Signed)
Patient called, left VM to return the call to the office to scheduled an appt for medication refill request.   

## 2022-05-25 LAB — HM DIABETES EYE EXAM

## 2022-05-29 ENCOUNTER — Other Ambulatory Visit: Payer: Self-pay | Admitting: Family Medicine

## 2022-05-29 DIAGNOSIS — I10 Essential (primary) hypertension: Secondary | ICD-10-CM

## 2022-05-29 NOTE — Telephone Encounter (Signed)
Requested medications are due for refill today.  yes  Requested medications are on the active medications list.  yes  Last refill. 05/08/2022 #30 0 refills - a courtesy refill  Future visit scheduled.   no  Notes to clinic.  Pt is more than 3 months over due for an OV. Courtesy refill already given.    Requested Prescriptions  Pending Prescriptions Disp Refills   metoprolol succinate (TOPROL-XL) 25 MG 24 hr tablet [Pharmacy Med Name: Metoprolol Succinate ER 25 MG Oral Tablet Extended Release 24 Hour] 30 tablet 0    Sig: Take 1 tablet by mouth once daily     Cardiovascular:  Beta Blockers Failed - 05/29/2022  1:07 PM      Failed - Valid encounter within last 6 months    Recent Outpatient Visits           1 year ago Annual physical exam   [facility] Smitty Cords, DO   1 year ago Type 2 diabetes mellitus with other specified complication, without long-term current use of insulin (HCC)   [facility] Charon, Netta Neat, DO   2 years ago Annual physical exam   [facility] [place], Netta Neat, DO   2 years ago Type 2 diabetes mellitus with other specified complication, without long-term current use of insulin [facility])   [facility] [place], Netta Neat, DO   2 years ago Diabetes mellitus associated with hormonal etiology [facility])   [facility] Johnson, Megan P, DO              Passed - Last BP in normal range    BP Readings from Last 1 Encounters:  04/15/21 129/72         Passed - Last Heart Rate in normal range    Pulse Readings from Last 1 Encounters:  04/15/21 85

## 2022-11-12 ENCOUNTER — Other Ambulatory Visit: Payer: Self-pay

## 2022-11-12 DIAGNOSIS — I10 Essential (primary) hypertension: Secondary | ICD-10-CM

## 2022-11-12 DIAGNOSIS — E1169 Type 2 diabetes mellitus with other specified complication: Secondary | ICD-10-CM

## 2022-11-12 DIAGNOSIS — E785 Hyperlipidemia, unspecified: Secondary | ICD-10-CM

## 2022-11-12 DIAGNOSIS — Z125 Encounter for screening for malignant neoplasm of prostate: Secondary | ICD-10-CM

## 2022-11-13 ENCOUNTER — Other Ambulatory Visit: Payer: 59

## 2022-11-14 LAB — PSA: PSA: 0.81 ng/mL (ref <=4.00)

## 2022-11-14 LAB — CBC WITH DIFFERENTIAL/PLATELET
Absolute Monocytes: 697 cells/uL (ref 200–950)
Basophils Absolute: 34 cells/uL (ref 0–200)
Basophils Relative: 0.5 %
Eosinophils Absolute: 114 cells/uL (ref 15–500)
Eosinophils Relative: 1.7 %
HCT: 43.8 % (ref 38.5–50.0)
Hemoglobin: 15.1 g/dL (ref 13.2–17.1)
Lymphs Abs: 2191 cells/uL (ref 850–3900)
MCH: 31.2 pg (ref 27.0–33.0)
MCHC: 34.5 g/dL (ref 32.0–36.0)
MCV: 90.5 fL (ref 80.0–100.0)
MPV: 9.5 fL (ref 7.5–12.5)
Monocytes Relative: 10.4 %
Neutro Abs: 3665 cells/uL (ref 1500–7800)
Neutrophils Relative %: 54.7 %
Platelets: 243 10*3/uL (ref 140–400)
RBC: 4.84 10*6/uL (ref 4.20–5.80)
RDW: 12.9 % (ref 11.0–15.0)
Total Lymphocyte: 32.7 %
WBC: 6.7 10*3/uL (ref 3.8–10.8)

## 2022-11-14 LAB — COMPREHENSIVE METABOLIC PANEL
AG Ratio: 1.9 (calc) (ref 1.0–2.5)
ALT: 31 U/L (ref 9–46)
AST: 18 U/L (ref 10–35)
Albumin: 4.4 g/dL (ref 3.6–5.1)
Alkaline phosphatase (APISO): 86 U/L (ref 35–144)
BUN: 17 mg/dL (ref 7–25)
CO2: 23 mmol/L (ref 20–32)
Calcium: 9.6 mg/dL (ref 8.6–10.3)
Chloride: 103 mmol/L (ref 98–110)
Creat: 1.03 mg/dL (ref 0.70–1.35)
Globulin: 2.3 g/dL (calc) (ref 1.9–3.7)
Glucose, Bld: 163 mg/dL — ABNORMAL HIGH (ref 65–99)
Potassium: 4.3 mmol/L (ref 3.5–5.3)
Sodium: 138 mmol/L (ref 135–146)
Total Bilirubin: 0.5 mg/dL (ref 0.2–1.2)
Total Protein: 6.7 g/dL (ref 6.1–8.1)

## 2022-11-14 LAB — HEMOGLOBIN A1C
Hgb A1c MFr Bld: 8.7 % of total Hgb — ABNORMAL HIGH (ref <5.7)
Mean Plasma Glucose: 203 mg/dL
eAG (mmol/L): 11.2 mmol/L

## 2022-11-14 LAB — LIPID PANEL
Cholesterol: 164 mg/dL (ref <200)
HDL: 45 mg/dL (ref >=40)
LDL Cholesterol (Calc): 100 mg/dL (calc) — ABNORMAL HIGH
Non-HDL Cholesterol (Calc): 119 mg/dL (calc) (ref <130)
Total CHOL/HDL Ratio: 3.6 (calc) (ref <5.0)
Triglycerides: 92 mg/dL (ref <150)

## 2022-11-14 LAB — TSH: TSH: 2.59 mIU/L (ref 0.40–4.50)

## 2022-11-17 ENCOUNTER — Other Ambulatory Visit: Payer: Self-pay | Admitting: Family Medicine

## 2022-11-17 DIAGNOSIS — E1169 Type 2 diabetes mellitus with other specified complication: Secondary | ICD-10-CM

## 2022-11-20 ENCOUNTER — Ambulatory Visit (INDEPENDENT_AMBULATORY_CARE_PROVIDER_SITE_OTHER): Payer: 59 | Admitting: Family Medicine

## 2022-11-20 ENCOUNTER — Other Ambulatory Visit: Payer: Self-pay | Admitting: Family Medicine

## 2022-11-20 VITALS — BP 140/88 | HR 79 | Ht 74.0 in | Wt 225.4 lb

## 2022-11-20 DIAGNOSIS — I10 Essential (primary) hypertension: Secondary | ICD-10-CM

## 2022-11-20 DIAGNOSIS — E785 Hyperlipidemia, unspecified: Secondary | ICD-10-CM

## 2022-11-20 DIAGNOSIS — Z1211 Encounter for screening for malignant neoplasm of colon: Secondary | ICD-10-CM | POA: Diagnosis not present

## 2022-11-20 DIAGNOSIS — E1169 Type 2 diabetes mellitus with other specified complication: Secondary | ICD-10-CM | POA: Diagnosis not present

## 2022-11-20 DIAGNOSIS — Z0000 Encounter for general adult medical examination without abnormal findings: Secondary | ICD-10-CM

## 2022-11-20 MED ORDER — METOPROLOL SUCCINATE ER 25 MG PO TB24: 25.0000 mg | ORAL_TABLET | Freq: Every day | ORAL | 3 refills | Status: DC

## 2022-11-20 MED ORDER — METFORMIN HCL 500 MG PO TABS: 1000.0000 mg | ORAL_TABLET | Freq: Two times a day (BID) | ORAL | 3 refills | Status: DC

## 2022-11-20 MED ORDER — ROSUVASTATIN CALCIUM 5 MG PO TABS: 5.0000 mg | ORAL_TABLET | Freq: Every day | ORAL | 3 refills | Status: DC

## 2022-11-20 NOTE — Assessment & Plan Note (Signed)
Previously controlled Elevated Lipid to LDL 100 Off Statin, ran out did not resume  Plan: RESTART - Rosuvastatin 5mg  nightly 2. Continue ASA 81mg  for primary ASCVD risk reduction 3. Encourage improved lifestyle - low carb/cholesterol, reduce portion size, continue improving regular exercise

## 2022-11-20 NOTE — Patient Instructions (Addendum)
Thank you for coming to the office today.  Add back Metoprolol Add Back Rosuvastatin rx   Look into the following diabetic medications  - Januvia - Farxiga - Jardiance  Or other meds available.  ------ Glipizide Amaryl / Glimepiride  - These meds could work but they are not the best meds, but they are cheap and covered   Saw Palmetto 80 to 160 mg per dose twice a day for improving Urinary Symptoms.  If still bothered, we can circle back around and consider other rx management / finger test evaluation for prostate exam and Urology consult.  ------------------  Ordered the Cologuard (home kit) test for colon cancer screening. Stay tuned for further updates.  It will be shipped to you directly. If not received in 2-4 weeks, call us or the company.   If you send it back and no results are received in 2-4 weeks, call us or the company as well!   Colon Cancer Screening: - For all adults age 33+ routine colon cancer screening is highly recommended.     - Recent guidelines from [employer] recommend starting age of 22 - Early detection of colon cancer is important, because often there are no warning signs or symptoms, also if found early usually it can be cured. Late stage is hard to treat.   - If Cologuard is NEGATIVE, then it is good for 3 years before next due - If Cologuard is POSITIVE, then it is strongly advised to get a Colonoscopy, which allows the GI doctor to locate the source of the cancer or polyp (even very early stage) and treat it by removing it. ------------------------- Follow instructions to collect sample, you may call the company for any help or questions, 24/7 telephone support at [phone].   Please schedule a Follow-up Appointment to: Return in about 5 months (around 04/20/2023) for 5 month fasting lab only then 1 week later Follow up DM, HLD, HTN, BPH.  If you have any other questions or concerns, please feel free to call the office or send  a message through [place]. You may also schedule an earlier appointment if necessary.  Additionally, you may be receiving a survey about your experience at our office within a few days to 1 week by e-mail or mail. We value your feedback.  Nobie Putnam, DO [place]

## 2022-11-20 NOTE — Assessment & Plan Note (Signed)
Elevated BP. Manual recheck still elevated. He is off metoprolol currently  Previously on [place] study for BP Occasional recent elevated readings No known complications    Plan:  RESTART Metoprolol XL 25mg  daily Continue current BP regimen Benazepril 40mg  daily 3. Encourage improved lifestyle - low sodium diet, regular exercise 4. Continue monitor BP outside office, bring readings to next visit, if persistently >140/90 or new symptoms notify office sooner

## 2022-11-20 NOTE — Assessment & Plan Note (Signed)
Elevated A1c to 8.7, uncontrolled DIABETES Off Rybelsus due to cost. He has high deductible plan. Goal to improve lifestyle Complicated w/ HLD, HTN - increase cardiovascular risk complications  Plan:  1. Continue Metformin 500mg  x 2 = 1000mg  TWICE A DAY 2. Encourage improved lifestyle - low carb, low sugar diet, reduce portion size, continue improving regular exercise 3. Check CBG , bring log to next visit for review 4. Continue ACEi , Statin DIABETES Foot check Eye Exa 05/2023 Urine microalbumin - collect next visit if cannot go  AVS w other DIABETES med options consider until can get better therapy covered on medicare

## 2022-11-20 NOTE — Progress Notes (Signed)
Subjective:    Patient ID: Shawn Moore, male    DOB: Mar 05, 1961, 61 y.o.   MRN: [id number]  Shawn Moore is a 62 y.o. male presenting on 11/20/2022 for Annual Exam   HPI  Here for Annual Physical and Lab Review.   CHRONIC HTN: He is followed by [facility] as well. Current Meds - Benazepril 40mg  daily - OFF Metoprolol XL 25mg  daily ran out of med. Reports good compliance, took meds today. Tolerating well, w/o complaints. Admits headaches episodic Denies CP, dyspnea, edema, dizziness / lightheadedness   CHRONIC DM, Type 2 A1c elevated now Meds: Metformin 1000mg  TWICE A DAY - Previously on Rybelsus  Reports good compliance. Tolerating well w/o side-effects Currently on ACEi Fam history Father Type 1 Diabetes. Brother is Type 2 Diabetic. Diet goal to improve Denies hypoglycemia, polyuria, visual changes, numbness or tingling.   HYPERLIPIDEMIA: Last lipid panel 10/2022 elevated LDL to 100 still within range but off Statin Off Rosuvastatin. No side effect just ran out.   Headache, disorder / Episodic Memory Loss Prior traumatic head injury years ago. Last seen by Dr [facility] 11/2017 He admits episodes are worse in afternoon evening if he feels dehydrated or not as much nutrition can trigger provoked symptoms. Issues with heat, not eating properly, issue with heat and poor hydrated. Says can impact him and cause some confusion or mood changes.    Health Maintenance:   Prior Colonoscopy 2012 Did not pursue repeat Prior FIT Testing Cologuard ordered   No prior PNA vaccine, wait until age 50   COVID19 Vaccines updated, Pfizer 01/27/20, 02/20/20   Last PSA 0.0.67 03/2021 negative      04/15/2021    1:37 PM 09/16/2020   10:11 AM 09/16/2020    9:46 AM  Depression screen PHQ 2/9  Decreased Interest 0 0 0  Down, Depressed, Hopeless 0 0 0  PHQ - 2 Score 0 0 0  Altered sleeping 0    Tired, decreased energy 1    Change in appetite 0     Feeling bad or failure about yourself  0    Trouble concentrating 0    Moving slowly or fidgety/restless 0    Suicidal thoughts 0    PHQ-9 Score 1    Difficult doing work/chores Not difficult at all      Past Medical History:  Diagnosis Date   Alcohol abuse    Elevated PSA    Headache    Hyperlipidemia    Hypertension    Injury of back due to fall    Past Surgical History:  Procedure Laterality Date   TONSILLECTOMY     Social History   Socioeconomic History   Marital status: Married    Spouse name: Not on file   Number of children: Not on file   Years of education: Not on file   Highest education level: Not on file  Occupational History   Not on file  Tobacco Use   Smoking status: Never   Smokeless tobacco: Never  Vaping Use   Vaping Use: Never used  Substance and Sexual Activity   Alcohol use: No    Comment: 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   Drug use: No   Sexual activity: Not on file  Other Topics Concern   Not on file  Social History Narrative   Not on file   Social Determinants of Health   Financial Resource Strain: Not on file  Food Insecurity: Not on file  Transportation  Needs: Not on file  Physical Activity: Not on file  Stress: Not on file  Social Connections: Not on file  Intimate Partner Violence: Not on file   Family History  Problem Relation Age of Onset   Kidney disease Father    Diabetes Father    Bladder Cancer Father 63   Healthy Mother    Current Outpatient Medications on File Prior to Visit  Medication Sig   aspirin EC 81 MG tablet Take 81 mg by mouth daily.   benazepril (LOTENSIN) 40 MG tablet Take 1 tablet by mouth once daily   Omega-3 1000 MG CAPS Take by mouth.   terbinafine (LAMISIL) 250 MG tablet Take 1 tablet (250 mg total) by mouth daily.   No current facility-administered medications on file prior to visit.    Review of Systems  Constitutional:  Negative for activity change, appetite change, chills,  diaphoresis, fatigue and fever.  HENT:  Negative for congestion and hearing loss.   Eyes:  Negative for visual disturbance.  Respiratory:  Negative for cough, chest tightness, shortness of breath and wheezing.   Cardiovascular:  Negative for chest pain, palpitations and leg swelling.  Gastrointestinal:  Negative for abdominal pain, constipation, diarrhea, nausea and vomiting.       Heartburn  Genitourinary:  Negative for dysuria, frequency and hematuria.  Musculoskeletal:  Negative for arthralgias and neck pain.  Skin:  Negative for rash.  Neurological:  Negative for dizziness, weakness, light-headedness, numbness and headaches.  Hematological:  Negative for adenopathy.  Psychiatric/Behavioral:  Negative for behavioral problems, dysphoric mood and sleep disturbance.    Per HPI unless specifically indicated above      Objective:    BP (!) 140/88 (BP Location: Left Arm, Cuff Size: Normal)   Pulse 79   Ht 6\' 2"  (1.88 m)   Wt 225 lb 6.4 oz (102.2 kg)   SpO2 99%   BMI 28.94 kg/m   Wt Readings from Last 3 Encounters:  11/20/22 225 lb 6.4 oz (102.2 kg)  04/15/21 218 lb 6.4 oz (99.1 kg)  09/16/20 218 lb 9.6 oz (99.2 kg)    Physical Exam Vitals and nursing note reviewed.  Constitutional:      General: He is not in acute distress.    Appearance: He is well-developed. He is not diaphoretic.     Comments: Well-appearing, comfortable, cooperative  HENT:     Head: Normocephalic and atraumatic.  Eyes:     General:        Right eye: No discharge.        Left eye: No discharge.     Conjunctiva/sclera: Conjunctivae normal.     Pupils: Pupils are equal, round, and reactive to light.  Neck:     Thyroid: No thyromegaly.  Cardiovascular:     Rate and Rhythm: Normal rate and regular rhythm.     Pulses: Normal pulses.     Heart sounds: Normal heart sounds. No murmur heard. Pulmonary:     Effort: Pulmonary effort is normal. No respiratory distress.     Breath sounds: Normal breath  sounds. No wheezing or rales.  Abdominal:     General: Bowel sounds are normal. There is no distension.     Palpations: Abdomen is soft. There is no mass.     Tenderness: There is no abdominal tenderness.  Musculoskeletal:        General: No tenderness. Normal range of motion.     Cervical back: Normal range of motion and neck supple.  Comments: Upper / Lower Extremities: - Normal muscle tone, strength bilateral upper extremities 5/5, lower extremities 5/5  Lymphadenopathy:     Cervical: No cervical adenopathy.  Skin:    General: Skin is warm and dry.     Findings: No erythema or rash.  Neurological:     Mental Status: He is alert and oriented to person, place, and time.     Comments: Distal sensation intact to light touch all extremities  Psychiatric:        Mood and Affect: Mood normal.        Behavior: Behavior normal.        Thought Content: Thought content normal.     Comments: Well groomed, good eye contact, normal speech and thoughts     Diabetic Foot Exam - Simple   Simple Foot Form Diabetic Foot exam was performed with the following findings: Yes 11/20/2022  1:53 PM  Visual Inspection No deformities, no ulcerations, no other skin breakdown bilaterally: Yes Sensation Testing Intact to touch and monofilament testing bilaterally: Yes Pulse Check Posterior Tibialis and Dorsalis pulse intact bilaterally: Yes Comments    Recent Labs    11/13/22 0918  HGBA1C 8.7*     Results for orders placed or performed in visit on 11/12/22  CBC with Differential/Platelet  Result Value Ref Range   WBC 6.7 3.8 - 10.8 Thousand/uL   RBC 4.84 4.20 - 5.80 Million/uL   Hemoglobin 15.1 13.2 - 17.1 g/dL   HCT 43.8 38.5 - 50.0 %   MCV 90.5 80.0 - 100.0 fL   MCH 31.2 27.0 - 33.0 pg   MCHC 34.5 32.0 - 36.0 g/dL   RDW 12.9 11.0 - 15.0 %   Platelets 243 140 - 400 Thousand/uL   MPV 9.5 7.5 - 12.5 fL   Neutro Abs 3,665 1,500 - 7,800 cells/uL   Lymphs Abs 2,191 850 - 3,900 cells/uL    Absolute Monocytes 697 200 - 950 cells/uL   Eosinophils Absolute 114 15 - 500 cells/uL   Basophils Absolute 34 0 - 200 cells/uL   Neutrophils Relative % 54.7 %   Total Lymphocyte 32.7 %   Monocytes Relative 10.4 %   Eosinophils Relative 1.7 %   Basophils Relative 0.5 %  TSH  Result Value Ref Range   TSH 2.59 0.40 - 4.50 mIU/L  Hemoglobin A1c  Result Value Ref Range   Hgb A1c MFr Bld 8.7 (H) <5.7 % of total Hgb   Mean Plasma Glucose 203 mg/dL   eAG (mmol/L) 11.2 mmol/L  Lipid panel  Result Value Ref Range   Cholesterol 164 <200 mg/dL   HDL 45 > OR = 40 mg/dL   Triglycerides 92 <150 mg/dL   LDL Cholesterol (Calc) 100 (H) mg/dL (calc)   Total CHOL/HDL Ratio 3.6 <5.0 (calc)   Non-HDL Cholesterol (Calc) 119 <130 mg/dL (calc)  Comprehensive metabolic panel  Result Value Ref Range   Glucose, Bld 163 (H) 65 - 99 mg/dL   BUN 17 7 - 25 mg/dL   Creat 1.03 0.70 - 1.35 mg/dL   BUN/Creatinine Ratio SEE NOTE: 6 - 22 (calc)   Sodium 138 135 - 146 mmol/L   Potassium 4.3 3.5 - 5.3 mmol/L   Chloride 103 98 - 110 mmol/L   CO2 23 20 - 32 mmol/L   Calcium 9.6 8.6 - 10.3 mg/dL   Total Protein 6.7 6.1 - 8.1 g/dL   Albumin 4.4 3.6 - 5.1 g/dL   Globulin 2.3 1.9 - 3.7 g/dL (calc)  AG Ratio 1.9 1.0 - 2.5 (calc)   Total Bilirubin 0.5 0.2 - 1.2 mg/dL   Alkaline phosphatase (APISO) 86 35 - 144 U/L   AST 18 10 - 35 U/L   ALT 31 9 - 46 U/L  PSA  Result Value Ref Range   PSA 0.81 < OR = 4.00 ng/mL      Assessment & Plan:   Problem List Items Addressed This Visit     Essential hypertension    Elevated BP. Manual recheck still elevated. He is off metoprolol currently  Previously on [place] study for BP Occasional recent elevated readings No known complications    Plan:  RESTART Metoprolol XL 25mg  daily Continue current BP regimen Benazepril 40mg  daily 3. Encourage improved lifestyle - low sodium diet, regular exercise 4. Continue monitor BP outside office, bring readings to next  visit, if persistently >140/90 or new symptoms notify office sooner      Relevant Medications   metoprolol succinate (TOPROL-XL) 25 MG 24 hr tablet   rosuvastatin (CRESTOR) 5 MG tablet   Hyperlipidemia associated with type 2 diabetes mellitus ([place])    Previously controlled Elevated Lipid to LDL 100 Off Statin, ran out did not resume  Plan: RESTART - Rosuvastatin 5mg  nightly 2. Continue ASA 81mg  for primary ASCVD risk reduction 3. Encourage improved lifestyle - low carb/cholesterol, reduce portion size, continue improving regular exercise      Relevant Medications   metoprolol succinate (TOPROL-XL) 25 MG 24 hr tablet   metFORMIN (GLUCOPHAGE) 500 MG tablet   rosuvastatin (CRESTOR) 5 MG tablet   Type 2 diabetes mellitus with other specified complication (HCC)    Elevated A1c to 8.7, uncontrolled DIABETES Off Rybelsus due to cost. He has high deductible plan. Goal to improve lifestyle Complicated w/ HLD, HTN - increase cardiovascular risk complications  Plan:  1. Continue Metformin 500mg  x 2 = 1000mg  TWICE A DAY 2. Encourage improved lifestyle - low carb, low sugar diet, reduce portion size, continue improving regular exercise 3. Check CBG , bring log to next visit for review 4. Continue ACEi , Statin DIABETES Foot check Eye Exa 05/2023 Urine microalbumin - collect next visit if cannot go  AVS w other DIABETES med options consider until can get better therapy covered on medicare      Relevant Medications   metFORMIN (GLUCOPHAGE) 500 MG tablet   rosuvastatin (CRESTOR) 5 MG tablet   Other Relevant Orders   Urine Microalbumin w/creat. ratio   Other Visit Diagnoses     Annual physical exam    -  Primary   Screening for colon cancer       Relevant Orders   Cologuard       Updated Health Maintenance information Reviewed recent lab results with patient Encouraged improvement to lifestyle with diet and exercise Goal of weight loss  Orders Placed This Encounter   Procedures   Cologuard   Urine Microalbumin w/creat. ratio      Meds ordered this encounter  Medications   metoprolol succinate (TOPROL-XL) 25 MG 24 hr tablet    Sig: Take 1 tablet (25 mg total) by mouth daily.    Dispense:  90 tablet    Refill:  3   metFORMIN (GLUCOPHAGE) 500 MG tablet    Sig: Take 2 tablets (1,000 mg total) by mouth 2 (two) times daily with a meal.    Dispense:  360 tablet    Refill:  3    Add future refills   rosuvastatin (CRESTOR) 5 MG  tablet    Sig: Take 1 tablet (5 mg total) by mouth daily.    Dispense:  90 tablet    Refill:  3     Follow up plan: Return in about 5 months (around 04/20/2023) for 5 month fasting lab only then 1 week later Follow up DM, HLD, HTN, BPH.  Future labs Lipid A1c 04/2023  Nobie Putnam, [facility] 11/20/2022, 1:22 PM

## 2022-12-09 LAB — COLOGUARD: COLOGUARD: NEGATIVE

## 2022-12-14 ENCOUNTER — Encounter: Payer: Self-pay | Admitting: Family Medicine

## 2022-12-14 ENCOUNTER — Ambulatory Visit (INDEPENDENT_AMBULATORY_CARE_PROVIDER_SITE_OTHER): Payer: 59 | Admitting: Family Medicine

## 2022-12-14 VITALS — BP 140/78 | HR 98 | Ht 74.0 in | Wt 219.2 lb

## 2022-12-14 DIAGNOSIS — J0110 Acute frontal sinusitis, unspecified: Secondary | ICD-10-CM | POA: Diagnosis not present

## 2022-12-14 DIAGNOSIS — J9801 Acute bronchospasm: Secondary | ICD-10-CM | POA: Diagnosis not present

## 2022-12-14 MED ORDER — GUAIFENESIN-CODEINE 100-10 MG/5ML PO SYRP: 5.0000 mL | ORAL_SOLUTION | Freq: Four times a day (QID) | ORAL | 0 refills | Status: DC | PRN

## 2022-12-14 MED ORDER — ALBUTEROL SULFATE HFA 108 (90 BASE) MCG/ACT IN AERS: 2.0000 | INHALATION_SPRAY | RESPIRATORY_TRACT | 2 refills | Status: AC | PRN

## 2022-12-14 MED ORDER — PREDNISONE 10 MG PO TABS: ORAL_TABLET | ORAL | 0 refills | Status: DC

## 2022-12-14 MED ORDER — LEVOFLOXACIN 500 MG PO TABS: 500.0000 mg | ORAL_TABLET | Freq: Every day | ORAL | 0 refills | Status: DC

## 2022-12-14 NOTE — Patient Instructions (Addendum)
Thank you for coming to the office today.  Likely some sort of post viral syndrome  Can be initially sinusitis > leading into bronchitis vs pneumonia  Treatment with strong antibiotic Levaquin dailyi for 7 days, use coupon if not covered Caution with high impact intensity activity to avoid muscle tendon injury  Add in the Albuterol rescue inhaler as needed for coughing spells, especially at night  Add Codeine cough syrup for cough  Last resort would be add Prednisone steroid taper 6 day for coughing / breathing if not improving within 24-48 hours. Caution it can raise BP and BLood Sugar.  If not improving, can do Chest X-ray   Please schedule a Follow-up Appointment to: Return if symptoms worsen or fail to improve.  If you have any other questions or concerns, please feel free to call the office or send a message through [place]. You may also schedule an earlier appointment if necessary.  Additionally, you may be receiving a survey about your experience at our office within a few days to 1 week by e-mail or mail. We value your feedback.  Nobie Putnam, DO [place]

## 2022-12-14 NOTE — Addendum Note (Signed)
Addended by: Olin Hauser on: 12/14/2022 12:19 PM   Modules accepted: Level of Service

## 2022-12-14 NOTE — Progress Notes (Signed)
Subjective:    Patient ID: Shawn Moore, male    DOB: November 04, 1960, 62 y.o.   MRN: [id number]  Shawn Moore is a 62 y.o. male presenting on 12/14/2022 for Cough  Patient presents for a same day appointment.  HPI  Sinusitis Post viral syndrome Prior had URI vs viral syndrome around Christmas holiday, some improvement but never fully resolved. Now recently traveled recently has now had 2 weeks of worsening cough productive symptoms. Not having fever or chills or body aches. Admits cough worse at night and some shortness of breath, sinus pressure congestion  Coricidin HBP day and night AS NEEDED with some relief Worse cough at night      04/15/2021    1:37 PM 09/16/2020   10:11 AM 09/16/2020    9:46 AM  Depression screen PHQ 2/9  Decreased Interest 0 0 0  Down, Depressed, Hopeless 0 0 0  PHQ - 2 Score 0 0 0  Altered sleeping 0    Tired, decreased energy 1    Change in appetite 0    Feeling bad or failure about yourself  0    Trouble concentrating 0    Moving slowly or fidgety/restless 0    Suicidal thoughts 0    PHQ-9 Score 1    Difficult doing work/chores Not difficult at all      Social History   Tobacco Use   Smoking status: Never   Smokeless tobacco: Never  Vaping Use   Vaping Use: Never used  Substance Use Topics   Alcohol use: No    Comment: Recovering Network engineer   Drug use: No    Review of Systems Per HPI unless specifically indicated above     Objective:    BP (!) 140/78   Pulse 98   Ht '6\' 2"'$  (1.88 m)   Wt 219 lb 3.2 oz (99.4 kg)   SpO2 99%   BMI 28.14 kg/m   Wt Readings from Last 3 Encounters:  12/14/22 219 lb 3.2 oz (99.4 kg)  11/20/22 225 lb 6.4 oz (102.2 kg)  04/15/21 218 lb 6.4 oz (99.1 kg)    Physical Exam Vitals and nursing note reviewed.  Constitutional:      General: He is not in acute distress.    Appearance: He is well-developed. He is not diaphoretic.     Comments: Well-appearing, comfortable, cooperative   HENT:     Head: Normocephalic and atraumatic.  Eyes:     General:        Right eye: No discharge.        Left eye: No discharge.     Conjunctiva/sclera: Conjunctivae normal.  Neck:     Thyroid: No thyromegaly.  Cardiovascular:     Rate and Rhythm: Normal rate and regular rhythm.     Pulses: Normal pulses.     Heart sounds: Normal heart sounds. No murmur heard. Pulmonary:     Effort: Pulmonary effort is normal. No respiratory distress.     Breath sounds: Wheezing and rhonchi present. No rales.     Comments: coughing Musculoskeletal:        General: Normal range of motion.     Cervical back: Normal range of motion and neck supple.  Lymphadenopathy:     Cervical: No cervical adenopathy.  Skin:    General: Skin is warm and dry.     Findings: No erythema or rash.  Neurological:     Mental Status: He is alert and oriented to person, place, and  time. Mental status is at baseline.  Psychiatric:        Behavior: Behavior normal.     Comments: Well groomed, good eye contact, normal speech and thoughts    Results for orders placed or performed in visit on 11/20/22  Cologuard  Result Value Ref Range   COLOGUARD Negative Negative      Assessment & Plan:   Problem List Items Addressed This Visit   None Visit Diagnoses     Acute non-recurrent frontal sinusitis    -  Primary   Relevant Medications   levofloxacin (LEVAQUIN) 500 MG tablet   predniSONE (DELTASONE) 10 MG tablet   guaiFENesin-codeine (ROBITUSSIN AC) 100-10 MG/5ML syrup   Bronchospasm, acute       Relevant Medications   albuterol (VENTOLIN HFA) 108 (90 Base) MCG/ACT inhaler   predniSONE (DELTASONE) 10 MG tablet   guaiFENesin-codeine (ROBITUSSIN AC) 100-10 MG/5ML syrup       Likely some sort of post viral syndrome Can be initially sinusitis > leading into bronchitis vs pneumonia History of pneumonia in past  Empiric therapy for pneumonia / Start taking Levaquin antibiotic '500mg'$  daily x 7 days Caution with  high intensity activity to avoid muscle tendon injury  Add in the Albuterol rescue inhaler as needed for coughing spells, especially at night  Add Codeine cough syrup for cough  Last resort would be add Prednisone steroid taper 6 day for coughing / breathing if not improving within 24-48 hours. Caution it can raise BP and Blood Sugar.  If not improving, can do Chest X-ray, return / notify office.  Meds ordered this encounter  Medications   levofloxacin (LEVAQUIN) 500 MG tablet    Sig: Take 1 tablet (500 mg total) by mouth daily. For 7 days    Dispense:  7 tablet    Refill:  0   albuterol (VENTOLIN HFA) 108 (90 Base) MCG/ACT inhaler    Sig: Inhale 2 puffs into the lungs every 4 (four) hours as needed for wheezing or shortness of breath.    Dispense:  8 g    Refill:  2   DISCONTD: guaiFENesin-codeine (ROBITUSSIN AC) 100-10 MG/5ML syrup    Sig: Take 5-10 mLs by mouth 4 (four) times daily as needed for cough.    Dispense:  180 mL    Refill:  0   predniSONE (DELTASONE) 10 MG tablet    Sig: Take 6 tabs with breakfast Day 1, 5 tabs Day 2, 4 tabs Day 3, 3 tabs Day 4, 2 tabs Day 5, 1 tab Day 6.    Dispense:  21 tablet    Refill:  0   guaiFENesin-codeine (ROBITUSSIN AC) 100-10 MG/5ML syrup    Sig: Take 5-10 mLs by mouth 4 (four) times daily as needed for cough.    Dispense:  180 mL    Refill:  0      Follow up plan: Return if symptoms worsen or fail to improve.   Nobie Putnam, [facility] 12/14/2022, 10:44 AM

## 2022-12-16 ENCOUNTER — Other Ambulatory Visit: Payer: Self-pay | Admitting: Family Medicine

## 2022-12-16 DIAGNOSIS — J9801 Acute bronchospasm: Secondary | ICD-10-CM

## 2023-01-12 ENCOUNTER — Encounter: Payer: Self-pay | Admitting: Family Medicine

## 2023-01-12 ENCOUNTER — Telehealth (INDEPENDENT_AMBULATORY_CARE_PROVIDER_SITE_OTHER): Payer: 59 | Admitting: Family Medicine

## 2023-01-12 ENCOUNTER — Other Ambulatory Visit: Payer: Self-pay | Admitting: Family Medicine

## 2023-01-12 VITALS — Wt 219.0 lb

## 2023-01-12 DIAGNOSIS — I10 Essential (primary) hypertension: Secondary | ICD-10-CM

## 2023-01-12 DIAGNOSIS — J9801 Acute bronchospasm: Secondary | ICD-10-CM

## 2023-01-12 DIAGNOSIS — J0110 Acute frontal sinusitis, unspecified: Secondary | ICD-10-CM | POA: Diagnosis not present

## 2023-01-12 MED ORDER — AMOXICILLIN-POT CLAVULANATE 875-125 MG PO TABS: 1.0000 | ORAL_TABLET | Freq: Two times a day (BID) | ORAL | 0 refills | Status: DC

## 2023-01-12 MED ORDER — PREDNISONE 20 MG PO TABS: ORAL_TABLET | ORAL | 0 refills | Status: DC

## 2023-01-12 MED ORDER — GUAIFENESIN-CODEINE 100-10 MG/5ML PO SYRP: 5.0000 mL | ORAL_SOLUTION | Freq: Four times a day (QID) | ORAL | 0 refills | Status: DC | PRN

## 2023-01-12 NOTE — Progress Notes (Signed)
Subjective:    Patient ID: Shawn Moore, male    DOB: [birth year], 62 y.o.   MRN: [id number]  Shawn Moore is a 62 y.o. male presenting on 01/12/2023 for Sinusitis  Virtual / Telehealth Encounter - Video Visit via [place] The purpose of this virtual visit is to provide medical care while limiting exposure to the novel coronavirus (COVID19) for both patient and office staff.  Consent was obtained for remote visit:  Yes.   Answered questions that patient had about telehealth interaction:  Yes.   I discussed the limitations, risks, security and privacy concerns of performing an evaluation and management service by video/telephone. I also discussed with the patient that there may be a patient responsible charge related to this service. The patient expressed understanding and agreed to proceed.  Patient Location: Home Provider Location: [facility] (Office)  Participants in virtual visit: - Patient: Shawn Moore - CMA: Orinda Kenner, CMA - Provider: Dr Parks Ranger   HPI  Sinusitis / Bronchitis Last visit 12/14/22 - treated for sinusitis and bronchitis/bronchospasm. he was treated with Levaquin, Prednisone taper, Codeine cough syrup, Albuterol AS NEEDED. He felt 99% improved  Now return of symptoms. He said symptoms started over past weekend with Sat/Sun onset with sinus congestion nose and head and chest and some drainage of congestion, seems at the moment the cough has been worsening - Past history of pneumonia / bronchitis complicated before. - He has never smoked. He has some asthma like symptoms with cough however - No prior history of COPD or other chronic lung conditions      04/15/2021    1:37 PM 09/16/2020   10:11 AM 09/16/2020    9:46 AM  Depression screen PHQ 2/9  Decreased Interest 0 0 0  Down, Depressed, Hopeless 0 0 0  PHQ - 2 Score 0 0 0  Altered sleeping 0    Tired, decreased energy 1    Change in appetite 0    Feeling bad or failure about  yourself  0    Trouble concentrating 0    Moving slowly or fidgety/restless 0    Suicidal thoughts 0    PHQ-9 Score 1    Difficult doing work/chores Not difficult at all      Social History   Tobacco Use   Smoking status: Never   Smokeless tobacco: Never  Vaping Use   Vaping Use: Never used  Substance Use Topics   Alcohol use: No    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 Abd - cannot examine virtually  Skin - face visible today- no rash Neuro - awake, alert, oriented Psych - not anxious appearing    Results for orders placed or performed in visit on 11/20/22  Cologuard  Result Value Ref Range   COLOGUARD Negative Negative  Assessment & Plan:   Problem List Items Addressed This Visit   None Visit Diagnoses     Acute non-recurrent frontal sinusitis    -  Primary   Relevant Medications   amoxicillin-clavulanate (AUGMENTIN) 875-125 MG tablet   predniSONE (DELTASONE) 20 MG tablet   guaiFENesin-codeine (ROBITUSSIN AC) 100-10 MG/5ML syrup   Bronchospasm, acute       Relevant Medications   guaiFENesin-codeine (ROBITUSSIN AC) 100-10 MG/5ML syrup       Consistent with acute frontal sinusitis, likely initially viral URI vs allergic rhinitis component with recurrence of sinusitis now. Recently resolved 1 month ago Cannot rule out bacterial infection No COPD or  chronic pulmonary condition  Plan: 1. Reassurance, likely self-limited - no indication for antibiotics at this time - will order Rx Augmentin 875-125mg  PO BID x 10 days if not improving within 48-72 hours 2. Prednisone taper 7 day 3. Codeine cough syrup 4. Nasal spray, OTC therapy Return criteria reviewed   Meds ordered this encounter  Medications   amoxicillin-clavulanate (AUGMENTIN) 875-125 MG tablet    Sig: Take 1 tablet by mouth 2 (two) times daily.    Dispense:  20 tablet    Refill:  0   predniSONE (DELTASONE) 20 MG tablet    Sig: Take daily with food. Start with 60mg  (3 pills) x 2 days, then reduce to 40mg  (2 pills) x 2 days, then 20mg  (1 pill) x 3 days    Dispense:  13 tablet    Refill:  0   guaiFENesin-codeine (ROBITUSSIN AC) 100-10 MG/5ML syrup    Sig: Take 5-10 mLs by mouth 4 (four) times daily as needed for cough.    Dispense:  180 mL    Refill:  0      Follow up plan: Return if symptoms worsen or fail to improve.   Patient verbalizes understanding with the above medical recommendations including the limitation of remote medical advice.  Specific follow-up and call-back criteria were given for patient to follow-up or seek medical care more urgently if needed.  Total duration of direct patient care provided via video conference: 10 minutes   Nobie Putnam, [facility] 01/12/2023, 4:38 PM

## 2023-01-12 NOTE — Telephone Encounter (Signed)
Requested Prescriptions  Pending Prescriptions Disp Refills   benazepril (LOTENSIN) 40 MG tablet [Pharmacy Med Name: Benazepril HCl 40 MG Oral Tablet] 90 tablet 0    Sig: Take 1 tablet by mouth once daily     Cardiovascular:  ACE Inhibitors Failed - 01/12/2023  6:50 AM      Failed - Last BP in normal range    BP Readings from Last 1 Encounters:  12/14/22 (!) 140/78         Passed - Cr in normal range and within 180 days    Creat  Date Value Ref Range Status  11/13/2022 1.03 0.70 - 1.35 mg/dL Final         Passed - K in normal range and within 180 days    Potassium  Date Value Ref Range Status  11/13/2022 4.3 3.5 - 5.3 mmol/L Final         Passed - Patient is not pregnant      Passed - Valid encounter within last 6 months    Recent Outpatient Visits           Today Acute non-recurrent frontal sinusitis   [facility] [place], Devonne Doughty, DO   4 weeks ago Acute non-recurrent frontal sinusitis   [facility] Olin Hauser, DO   1 month ago Annual physical exam   [facility], DO   1 year ago Annual physical exam   [facility] Olin Hauser, DO   2 years ago Type 2 diabetes mellitus with other specified complication, without long-term current use of insulin [facility])   [facility], Devonne Doughty, [place]

## 2023-01-12 NOTE — Patient Instructions (Addendum)
Likely recurrent sinus infection vs bronchitis We will cover for the congestion and breathing / cough now Start Prednisone taper Start cough syrup w codeine OTC options for allergies, Loratadine/Claritin and Flonase can help reduce congestion  If not improving recommend Augmentin antibiotic course can start in a few days if need  Follow up if not improving.  Please schedule a Follow-up Appointment to: Return if symptoms worsen or fail to improve.  If you have any other questions or concerns, please feel free to call the office or send a message through [place]. You may also schedule an earlier appointment if necessary.  Additionally, you may be receiving a survey about your experience at our office within a few days to 1 week by e-mail or mail. We value your feedback.  Shawn Putnam, DO [place]

## 2023-02-10 ENCOUNTER — Other Ambulatory Visit: Payer: Self-pay | Admitting: Family Medicine

## 2023-02-10 DIAGNOSIS — I10 Essential (primary) hypertension: Secondary | ICD-10-CM

## 2023-02-10 NOTE — Telephone Encounter (Signed)
Requested Prescriptions  Pending Prescriptions Disp Refills   benazepril (LOTENSIN) 40 MG tablet [Pharmacy Med Name: Benazepril HCl 40 MG Oral Tablet] 90 tablet 0    Sig: Take 1 tablet by mouth once daily     Cardiovascular:  ACE Inhibitors Failed - 02/10/2023  8:52 AM      Failed - Last BP in normal range    BP Readings from Last 1 Encounters:  12/14/22 (!) 140/78         Passed - Cr in normal range and within 180 days    Creat  Date Value Ref Range Status  11/13/2022 1.03 0.70 - 1.35 mg/dL Final         Passed - K in normal range and within 180 days    Potassium  Date Value Ref Range Status  11/13/2022 4.3 3.5 - 5.3 mmol/L Final         Passed - Patient is not pregnant      Passed - Valid encounter within last 6 months    Recent Outpatient Visits           4 weeks ago Acute non-recurrent frontal sinusitis   [place] [facility] Smitty Cords, DO   1 month ago Acute non-recurrent frontal sinusitis   [place] [facility] Smitty Cords, DO   2 months ago Annual physical exam   [place] [facility] Smitty Cords, DO   1 year ago Annual physical exam   [place] [facility] Smitty Cords, DO   2 years ago Type 2 diabetes mellitus with other specified complication, without long-term current use of insulin [facility])   [place] [facility] [place], Netta Neat, [place]

## 2023-05-02 ENCOUNTER — Other Ambulatory Visit: Payer: Self-pay | Admitting: Family Medicine

## 2023-05-02 DIAGNOSIS — I10 Essential (primary) hypertension: Secondary | ICD-10-CM

## 2023-05-03 NOTE — Telephone Encounter (Signed)
Requested Prescriptions  Pending Prescriptions Disp Refills   benazepril (LOTENSIN) 40 MG tablet [Pharmacy Med Name: Benazepril HCl 40 MG Oral Tablet] 90 tablet 0    Sig: Take 1 tablet by mouth once daily     Cardiovascular:  ACE Inhibitors Failed - 05/02/2023  7:51 AM      Failed - Last BP in normal range    BP Readings from Last 1 Encounters:  12/14/22 (!) 140/78         Passed - Cr in normal range and within 180 days    Creat  Date Value Ref Range Status  11/13/2022 1.03 0.70 - 1.35 mg/dL Final         Passed - K in normal range and within 180 days    Potassium  Date Value Ref Range Status  11/13/2022 4.3 3.5 - 5.3 mmol/L Final         Passed - Patient is not pregnant      Passed - Valid encounter within last 6 months    Recent Outpatient Visits           3 months ago Acute non-recurrent frontal sinusitis   [place] [facility], Netta Neat, DO   4 months ago Acute non-recurrent frontal sinusitis   [place] [facility] [place] [place], Netta Neat, DO   5 months ago Annual physical exam   [place] [facility] Smitty Cords, DO   2 years ago Annual physical exam   [facility] [place], Netta Neat, DO   2 years ago Type 2 diabetes mellitus with other specified complication, without long-term current use of insulin [facility])    [facility] [place], Netta Neat, [place]

## 2023-07-23 ENCOUNTER — Other Ambulatory Visit: Payer: Self-pay | Admitting: Family Medicine

## 2023-07-23 DIAGNOSIS — I10 Essential (primary) hypertension: Secondary | ICD-10-CM

## 2023-07-26 NOTE — Telephone Encounter (Signed)
Courtesy refill. Requested Prescriptions  Pending Prescriptions Disp Refills   benazepril (LOTENSIN) 40 MG tablet [Pharmacy Med Name: Benazepril HCl 40 MG Oral Tablet] 30 tablet 0    Sig: Take 1 tablet by mouth once daily     Cardiovascular:  ACE Inhibitors Failed - 07/23/2023  3:07 PM      Failed - Cr in normal range and within 180 days    Creat  Date Value Ref Range Status  11/13/2022 1.03 0.70 - 1.35 mg/dL Final         Failed - K in normal range and within 180 days    Potassium  Date Value Ref Range Status  11/13/2022 4.3 3.5 - 5.3 mmol/L Final         Failed - Last BP in normal range    BP Readings from Last 1 Encounters:  12/14/22 (!) 140/78         Failed - Valid encounter within last 6 months    Recent Outpatient Visits           6 months ago Acute non-recurrent frontal sinusitis   [facility] [facility] Smitty Cords, DO   7 months ago Acute non-recurrent frontal sinusitis   [place] [facility] Smitty Cords, DO   8 months ago Annual physical exam   [place] [facility] Smitty Cords, DO   2 years ago Annual physical exam   [place] [facility] [place], Netta Neat, DO   2 years ago Type 2 diabetes mellitus with other specified complication, without long-term current use of insulin [facility])   [place] [facility] [place], Netta Neat, [place] - Patient is not pregnant

## 2023-09-02 ENCOUNTER — Other Ambulatory Visit: Payer: Self-pay | Admitting: Family Medicine

## 2023-09-02 DIAGNOSIS — I10 Essential (primary) hypertension: Secondary | ICD-10-CM

## 2023-09-02 NOTE — Telephone Encounter (Signed)
Requested medication (s) are due for refill today: yes  Requested medication (s) are on the active medication list: yes  Last refill:  07/26/23  Future visit scheduled: no  Notes to clinic:  Unable to refill per protocol, courtesy refill already given, routing for provider approval.      Requested Prescriptions  Pending Prescriptions Disp Refills   benazepril (LOTENSIN) 40 MG tablet [Pharmacy Med Name: Benazepril HCl 40 MG Oral Tablet] 30 tablet 0    Sig: Take 1 tablet by mouth once daily     Cardiovascular:  ACE Inhibitors Failed - 09/02/2023  6:51 AM      Failed - Cr in normal range and within 180 days    Creat  Date Value Ref Range Status  11/13/2022 1.03 0.70 - 1.35 mg/dL Final         Failed - K in normal range and within 180 days    Potassium  Date Value Ref Range Status  11/13/2022 4.3 3.5 - 5.3 mmol/L Final         Failed - Last BP in normal range    BP Readings from Last 1 Encounters:  12/14/22 (!) 140/78         Failed - Valid encounter within last 6 months    Recent Outpatient Visits           7 months ago Acute non-recurrent frontal sinusitis   [place] [facility] Smitty Cords, DO   8 months ago Acute non-recurrent frontal sinusitis   [place] [facility] Smitty Cords, DO   9 months ago Annual physical exam   [place] [facility] Smitty Cords, DO   2 years ago Annual physical exam   [place] [facility] [place], Netta Neat, DO   2 years ago Type 2 diabetes mellitus with other specified complication, without long-term current use of insulin [facility])   [place] [facility] [place], Netta Neat, [place] - Patient is not pregnant

## 2023-09-03 ENCOUNTER — Other Ambulatory Visit: Payer: Self-pay | Admitting: Family Medicine

## 2023-09-03 DIAGNOSIS — I10 Essential (primary) hypertension: Secondary | ICD-10-CM

## 2023-09-03 DIAGNOSIS — E1169 Type 2 diabetes mellitus with other specified complication: Secondary | ICD-10-CM

## 2023-09-03 NOTE — Telephone Encounter (Signed)
Medication Refill -  Most Recent Primary Care Visit:  Provider: Smitty Cords  Department: [facility]  Visit Type: MYCHART VIDEO VISIT  Date: 01/12/2023  Medication:  benazepril (LOTENSIN) 40 MG tablet  rosuvastatin (CRESTOR) 5 MG tablet  metFORMIN (GLUCOPHAGE) 500 MG tablet  metoprolol succinate (TOPROL-XL) 25 MG 24 hr tablet  *Patient is wanting to stock up due to getting new insurance, patient is requesting 90 day refill up until next year.   Has the patient contacted their pharmacy? No, patient is requesting 90 day supply of medications to stock up   Is this the correct pharmacy for this prescription? Yes, the one listed below.  If no, delete pharmacy and type the correct one.  This is the patient's preferred pharmacy:  [facility] [address]), [place] - [address] [place]) [place] [address] Phone: [phone] Fax: [phone]   Has the prescription been filled recently? Some have.  Is the patient out of the medication? Patient is not, patient is requesting 90 day supply to stock up.  Has the patient been seen for an appointment in the last year OR does the patient have an upcoming appointment? Last had a CPE on 2.2.2024

## 2023-09-03 NOTE — Telephone Encounter (Signed)
Requested medication (s) are due for refill today: Yes  Requested medication (s) are on the active medication list: Yes  Last refill:  07/26/23 and 11/20/22  Future visit scheduled: No  Notes to clinic:  Unable to refill per protocol, appointment needed.      Requested Prescriptions  Pending Prescriptions Disp Refills   benazepril (LOTENSIN) 40 MG tablet 30 tablet 0    Sig: Take 1 tablet (40 mg total) by mouth daily.     Cardiovascular:  ACE Inhibitors Failed - 09/03/2023  1:17 PM      Failed - Cr in normal range and within 180 days    Creat  Date Value Ref Range Status  11/13/2022 1.03 0.70 - 1.35 mg/dL Final         Failed - K in normal range and within 180 days    Potassium  Date Value Ref Range Status  11/13/2022 4.3 3.5 - 5.3 mmol/L Final         Failed - Last BP in normal range    BP Readings from Last 1 Encounters:  12/14/22 (!) 140/78         Failed - Valid encounter within last 6 months    Recent Outpatient Visits           7 months ago Acute non-recurrent frontal sinusitis   [facility] [facility] Smitty Cords, DO   8 months ago Acute non-recurrent frontal sinusitis   [facility] Smitty Cords, DO   9 months ago Annual physical exam   [place] [facility] Smitty Cords, DO   2 years ago Annual physical exam   [place] [facility] [place], Netta Neat, DO   2 years ago Type 2 diabetes mellitus with other specified complication, without long-term current use of insulin [facility])   [place] [facility] [place], Netta Neat, [place] - Patient is not pregnant       rosuvastatin (CRESTOR) 5 MG tablet 90 tablet 3    Sig: Take 1 tablet (5 mg total) by mouth daily.     Cardiovascular:  Antilipid - Statins 2 Failed - 09/03/2023  1:17 PM      Failed - Lipid Panel in normal range within the last 12 months     Cholesterol, Total  Date Value Ref Range Status  02/08/2019 194 100 - 199 mg/dL Final   Cholesterol  Date Value Ref Range Status  11/13/2022 164 <200 mg/dL Final   LDL Cholesterol (Calc)  Date Value Ref Range Status  11/13/2022 100 (H) mg/dL (calc) Final    Comment:    Reference range: <100 . Desirable range <100 mg/dL for primary prevention;   <70 mg/dL for patients with CHD or diabetic patients  with > or = 2 CHD risk factors. Marland Kitchen LDL-C is now calculated using the Martin-Hopkins  calculation, which is a validated novel method providing  better accuracy than the Friedewald equation in the  estimation of LDL-C.  Horald Pollen et al. Lenox Ahr. 0981;191(47): 2061-2068  (http://education.[employer].com/faq/FAQ164)    HDL  Date Value Ref Range Status  11/13/2022 45 > OR = 40 mg/dL Final  82/95/6213 43 >08 mg/dL Final   Triglycerides  Date Value Ref Range Status  11/13/2022 92 <150 mg/dL Final         Passed - Cr in normal range and within  360 days    Creat  Date Value Ref Range Status  11/13/2022 1.03 0.70 - 1.35 mg/dL Final         Passed - Patient is not pregnant      Passed - Valid encounter within last 12 months    Recent Outpatient Visits           7 months ago Acute non-recurrent frontal sinusitis   [place] [facility] [place], Netta Neat, DO   8 months ago Acute non-recurrent frontal sinusitis   [place] [facility] Smitty Cords, DO   9 months ago Annual physical exam   [place] [facility] Smitty Cords, DO   2 years ago Annual physical exam   [place] [facility] Smitty Cords, DO   2 years ago Type 2 diabetes mellitus with other specified complication, without long-term current use of insulin (HCC)   [place] [facility] [place], Netta Neat, DO               metFORMIN (GLUCOPHAGE) 500 MG tablet 360 tablet 3     Sig: Take 2 tablets (1,000 mg total) by mouth 2 (two) times daily with a meal.     Endocrinology:  Diabetes - Biguanides Failed - 09/03/2023  1:17 PM      Failed - HBA1C is between 0 and 7.9 and within 180 days    HB A1C (BAYER DCA - WAIVED)  Date Value Ref Range Status  11/10/2017 6.4 <7.0 % Final    Comment:                                          Diabetic Adult            <7.0                                       Healthy Adult        4.3 - 5.7                                                           (DCCT/NGSP) American Diabetes Association's Summary of Glycemic Recommendations for Adults with Diabetes: Hemoglobin A1c <7.0%. More stringent glycemic goals (A1c <6.0%) may further reduce complications at the cost of increased risk of hypoglycemia.    Hgb A1c MFr Bld  Date Value Ref Range Status  11/13/2022 8.7 (H) <5.7 % of total Hgb Final    Comment:    For someone without known diabetes, a hemoglobin A1c value of 6.5% or greater indicates that they may have  diabetes and this should be confirmed with a follow-up  test. . For someone with known diabetes, a value <7% indicates  that their diabetes is well controlled and a value  greater than or equal to 7% indicates suboptimal  control. A1c targets should be individualized based on  duration of diabetes, age, comorbid conditions, and  other considerations. . Currently, no consensus exists regarding use of hemoglobin A1c for diagnosis of diabetes for children. Marland Kitchen  Failed - eGFR in normal range and within 360 days    GFR, Est African American  Date Value Ref Range Status  04/07/2021 86 > OR = 60 mL/min/1.28m2 Final   GFR, Est Non African American  Date Value Ref Range Status  04/07/2021 74 > OR = 60 mL/min/1.60m2 Final         Failed - B12 Level in normal range and within 720 days    No results found for: "VITAMINB12"       Failed - Valid encounter within last 6 months    Recent Outpatient Visits            7 months ago Acute non-recurrent frontal sinusitis   [place] [facility] [place], Netta Neat, DO   8 months ago Acute non-recurrent frontal sinusitis   [place] [facility] [place], Netta Neat, DO   9 months ago Annual physical exam   [place] [facility] [place], Netta Neat, DO   2 years ago Annual physical exam   [place] [facility] Smitty Cords, DO   2 years ago Type 2 diabetes mellitus with other specified complication, without long-term current use of insulin (HCC)   [place] [facility] [place], Netta Neat, DO              Passed - Cr in normal range and within 360 days    Creat  Date Value Ref Range Status  11/13/2022 1.03 0.70 - 1.35 mg/dL Final         Passed - CBC within normal limits and completed in the last 12 months    WBC  Date Value Ref Range Status  11/13/2022 6.7 3.8 - 10.8 Thousand/uL Final   RBC  Date Value Ref Range Status  11/13/2022 4.84 4.20 - 5.80 Million/uL Final   Hemoglobin  Date Value Ref Range Status  11/13/2022 15.1 13.2 - 17.1 g/dL Final  59/56/3875 64.3 13.0 - 17.7 g/dL Final   HCT  Date Value Ref Range Status  11/13/2022 43.8 38.5 - 50.0 % Final   Hematocrit  Date Value Ref Range Status  02/08/2019 47.2 37.5 - 51.0 % Final   MCHC  Date Value Ref Range Status  11/13/2022 34.5 32.0 - 36.0 g/dL Final   [facility]  Date Value Ref Range Status  11/13/2022 31.2 27.0 - 33.0 pg Final   MCV  Date Value Ref Range Status  11/13/2022 90.5 80.0 - 100.0 fL Final  02/08/2019 89 79 - 97 fL Final   No results found for: "PLTCOUNTKUC", "LABPLAT", "POCPLA" RDW  Date Value Ref Range Status  11/13/2022 12.9 11.0 - 15.0 % Final  02/08/2019 14.3 11.6 - 15.4 % Final          metoprolol succinate (TOPROL-XL) 25 MG 24 hr tablet 90 tablet 3    Sig: Take 1 tablet (25 mg total) by mouth daily.      Cardiovascular:  Beta Blockers Failed - 09/03/2023  1:17 PM      Failed - Last BP in normal range    BP Readings from Last 1 Encounters:  12/14/22 (!) 140/78         Failed - Valid encounter within last 6 months    Recent Outpatient Visits           7 months ago Acute non-recurrent frontal sinusitis   [facility] [place], Netta Neat, DO   8 months ago  Acute non-recurrent frontal sinusitis   [facility] Smitty Cords, DO   9 months ago Annual physical exam   [place] [facility] Smitty Cords, DO   2 years ago Annual physical exam   [place] [place], Netta Neat, DO   2 years ago Type 2 diabetes mellitus with other specified complication, without long-term current use of insulin [facility])   [place] [facility] [place], Netta Neat, [place]              Passed - Last Heart Rate in normal range    Pulse Readings from Last 1 Encounters:  12/14/22 98

## 2023-09-03 NOTE — Telephone Encounter (Signed)
Patient called, left VM to return the call to the office to schedule an OV for follow up.  ? ?

## 2023-09-06 MED ORDER — BENAZEPRIL HCL 40 MG PO TABS: 40.0000 mg | ORAL_TABLET | Freq: Every day | ORAL | 1 refills | Status: DC

## 2023-09-06 MED ORDER — METOPROLOL SUCCINATE ER 25 MG PO TB24: 25.0000 mg | ORAL_TABLET | Freq: Every day | ORAL | 1 refills | Status: DC

## 2023-09-06 MED ORDER — ROSUVASTATIN CALCIUM 5 MG PO TABS: 5.0000 mg | ORAL_TABLET | Freq: Every day | ORAL | 1 refills | Status: DC

## 2023-09-06 MED ORDER — METFORMIN HCL 500 MG PO TABS: 1000.0000 mg | ORAL_TABLET | Freq: Two times a day (BID) | ORAL | 1 refills | Status: DC

## 2023-09-06 NOTE — Telephone Encounter (Signed)
Copied from MyChart message sent to the patient and his reply regarding calling the office to schedule OV.       09/06/23  8:56 AM Hi Misty Stanley, I spoke with your office last week and was told I had an exam in February and wasn't due for another yet.  At least I think that's where we left it.   The refill request, for all 4 prescriptions is for 90 day supplies since I am changing jobs and health insurance carriers for 3 months until I go on Medicare in April.  The new job has a 90 day waiting period, I am exploring short term insurance options.   I am open to an exam before the end of the year if needed but can wait until Jan or Feb next year. ThanksTruddie Hidden Rozman  [phone]

## 2023-09-28 ENCOUNTER — Other Ambulatory Visit: Payer: Self-pay

## 2023-09-28 DIAGNOSIS — E1169 Type 2 diabetes mellitus with other specified complication: Secondary | ICD-10-CM

## 2023-09-28 DIAGNOSIS — I10 Essential (primary) hypertension: Secondary | ICD-10-CM

## 2023-09-28 MED ORDER — METFORMIN HCL 500 MG PO TABS: 1000.0000 mg | ORAL_TABLET | Freq: Two times a day (BID) | ORAL | 1 refills | Status: AC

## 2023-09-28 MED ORDER — BENAZEPRIL HCL 40 MG PO TABS: 40.0000 mg | ORAL_TABLET | Freq: Every day | ORAL | 1 refills | Status: DC

## 2023-09-28 MED ORDER — ROSUVASTATIN CALCIUM 5 MG PO TABS: 5.0000 mg | ORAL_TABLET | Freq: Every day | ORAL | 1 refills | Status: DC

## 2023-09-28 MED ORDER — METOPROLOL SUCCINATE ER 25 MG PO TB24: 25.0000 mg | ORAL_TABLET | Freq: Every day | ORAL | 1 refills | Status: DC

## 2024-03-28 ENCOUNTER — Telehealth: Payer: Self-pay

## 2024-03-28 NOTE — Telephone Encounter (Signed)
 Copied from CRM [id number]. Topic: Clinical - Request for Lab/Test Order >> Mar 28, 2024  9:55 AM Everette C wrote: Reason for CRM: The patient has called to request lab orders for testing prior to their physical appt on 04/25/24  Please contact the patient further if/when possible

## 2024-04-18 ENCOUNTER — Other Ambulatory Visit: Payer: Self-pay | Admitting: Family Medicine

## 2024-04-18 DIAGNOSIS — Z0000 Encounter for general adult medical examination without abnormal findings: Secondary | ICD-10-CM

## 2024-04-18 DIAGNOSIS — E1169 Type 2 diabetes mellitus with other specified complication: Secondary | ICD-10-CM

## 2024-04-18 DIAGNOSIS — Z125 Encounter for screening for malignant neoplasm of prostate: Secondary | ICD-10-CM

## 2024-04-18 DIAGNOSIS — I10 Essential (primary) hypertension: Secondary | ICD-10-CM

## 2024-04-19 ENCOUNTER — Other Ambulatory Visit: Payer: Self-pay

## 2024-04-19 DIAGNOSIS — Z0000 Encounter for general adult medical examination without abnormal findings: Secondary | ICD-10-CM | POA: Diagnosis not present

## 2024-04-19 DIAGNOSIS — E785 Hyperlipidemia, unspecified: Secondary | ICD-10-CM | POA: Diagnosis not present

## 2024-04-19 DIAGNOSIS — E1169 Type 2 diabetes mellitus with other specified complication: Secondary | ICD-10-CM | POA: Diagnosis not present

## 2024-04-19 DIAGNOSIS — I10 Essential (primary) hypertension: Secondary | ICD-10-CM | POA: Diagnosis not present

## 2024-04-20 ENCOUNTER — Ambulatory Visit: Payer: Self-pay | Admitting: Family Medicine

## 2024-04-20 LAB — COMPREHENSIVE METABOLIC PANEL WITH GFR
AG Ratio: 2.1 (calc) (ref 1.0–2.5)
ALT: 15 U/L (ref 9–46)
AST: 13 U/L (ref 10–35)
Albumin: 4.5 g/dL (ref 3.6–5.1)
Alkaline phosphatase (APISO): 100 U/L (ref 35–144)
BUN: 16 mg/dL (ref 7–25)
CO2: 28 mmol/L (ref 20–32)
Calcium: 9.5 mg/dL (ref 8.6–10.3)
Chloride: 103 mmol/L (ref 98–110)
Creat: 0.95 mg/dL (ref 0.70–1.35)
Globulin: 2.1 g/dL (ref 1.9–3.7)
Glucose, Bld: 187 mg/dL — ABNORMAL HIGH (ref 65–99)
Potassium: 4.4 mmol/L (ref 3.5–5.3)
Sodium: 137 mmol/L (ref 135–146)
Total Bilirubin: 0.5 mg/dL (ref 0.2–1.2)
Total Protein: 6.6 g/dL (ref 6.1–8.1)
eGFR: 89 mL/min/1.73m2 (ref >=60)

## 2024-04-20 LAB — MICROALBUMIN / CREATININE URINE RATIO
Creatinine, Urine: 156 mg/dL (ref 20–320)
Microalb Creat Ratio: 8 mg/g{creat} (ref <30)
Microalb, Ur: 1.2 mg/dL

## 2024-04-20 LAB — LIPID PANEL
Cholesterol: 122 mg/dL (ref <200)
HDL: 52 mg/dL (ref >=40)
LDL Cholesterol (Calc): 51 mg/dL
Non-HDL Cholesterol (Calc): 70 mg/dL (ref <130)
Total CHOL/HDL Ratio: 2.3 (calc) (ref <5.0)
Triglycerides: 109 mg/dL (ref <150)

## 2024-04-20 LAB — CBC WITH DIFFERENTIAL/PLATELET
Absolute Lymphocytes: 1760 {cells}/uL (ref 850–3900)
Absolute Monocytes: 599 {cells}/uL (ref 200–950)
Basophils Absolute: 32 {cells}/uL (ref 0–200)
Basophils Relative: 0.6 %
Eosinophils Absolute: 217 {cells}/uL (ref 15–500)
Eosinophils Relative: 4.1 %
HCT: 44 % (ref 38.5–50.0)
Hemoglobin: 14.3 g/dL (ref 13.2–17.1)
MCH: 30.6 pg (ref 27.0–33.0)
MCHC: 32.5 g/dL (ref 32.0–36.0)
MCV: 94 fL (ref 80.0–100.0)
MPV: 9.4 fL (ref 7.5–12.5)
Monocytes Relative: 11.3 %
Neutro Abs: 2692 {cells}/uL (ref 1500–7800)
Neutrophils Relative %: 50.8 %
Platelets: 235 Thousand/uL (ref 140–400)
RBC: 4.68 Million/uL (ref 4.20–5.80)
RDW: 13.1 % (ref 11.0–15.0)
Total Lymphocyte: 33.2 %
WBC: 5.3 10*3/uL (ref 3.8–10.8)

## 2024-04-20 LAB — HEMOGLOBIN A1C
Hgb A1c MFr Bld: 8.5 % — ABNORMAL HIGH (ref <5.7)
Mean Plasma Glucose: 197 mg/dL
eAG (mmol/L): 10.9 mmol/L

## 2024-04-20 LAB — TSH: TSH: 1.88 m[IU]/L (ref 0.40–4.50)

## 2024-04-20 LAB — PSA: PSA: 0.86 ng/mL (ref <=4.00)

## 2024-04-24 ENCOUNTER — Encounter: Payer: Self-pay | Admitting: Family Medicine

## 2024-04-24 ENCOUNTER — Ambulatory Visit (INDEPENDENT_AMBULATORY_CARE_PROVIDER_SITE_OTHER): Payer: Self-pay | Admitting: Family Medicine

## 2024-04-24 VITALS — BP 128/78 | HR 78 | Ht 74.0 in | Wt 217.6 lb

## 2024-04-24 DIAGNOSIS — Z0000 Encounter for general adult medical examination without abnormal findings: Secondary | ICD-10-CM

## 2024-04-24 DIAGNOSIS — E1169 Type 2 diabetes mellitus with other specified complication: Secondary | ICD-10-CM | POA: Diagnosis not present

## 2024-04-24 DIAGNOSIS — E785 Hyperlipidemia, unspecified: Secondary | ICD-10-CM | POA: Diagnosis not present

## 2024-04-24 DIAGNOSIS — Z7984 Long term (current) use of oral hypoglycemic drugs: Secondary | ICD-10-CM | POA: Diagnosis not present

## 2024-04-24 DIAGNOSIS — Z23 Encounter for immunization: Secondary | ICD-10-CM

## 2024-04-24 DIAGNOSIS — I10 Essential (primary) hypertension: Secondary | ICD-10-CM

## 2024-04-24 MED ORDER — METOPROLOL SUCCINATE ER 25 MG PO TB24: 25.0000 mg | ORAL_TABLET | Freq: Every day | ORAL | 3 refills | Status: AC

## 2024-04-24 MED ORDER — ROSUVASTATIN CALCIUM 5 MG PO TABS: 5.0000 mg | ORAL_TABLET | Freq: Every day | ORAL | 3 refills | Status: AC

## 2024-04-24 MED ORDER — BENAZEPRIL HCL 40 MG PO TABS: 40.0000 mg | ORAL_TABLET | Freq: Every day | ORAL | 3 refills | Status: AC

## 2024-04-24 NOTE — Patient Instructions (Addendum)
 Thank you for coming to the office today.  Recent Labs    04/19/24 0835  HGBA1C 8.5*   Check into cost and coverage  GLP1 Injection (weekly) - Mounjaro - Ozempic  Pills - Rybelsus  (ozempic pill version) - Januvia - Farxiga - Jardiance  Ideally we can arrange a new rx plan, and even test a sample for 4-6 weeks if you find out which one we are aiming for.  If still difficulty with confirming cost coverage, we can just try to order, or refer to our clinical pharmacist.   You have been referred for a Coronary Calcium  Score Cardiac CT Scan. This is a screening test for patients aged 10-50+ with cardiovascular risk factors or who are healthy but would be interested in Cardiovascular Screening for heart disease. Even if there is a family history of heart disease, this imaging can be useful. Typically it can be done every 5+ years or at a different timeline we agree on  The scan will look at the chest and mainly focus on the heart and identify early signs of calcium  build up or blockages within the heart arteries. It is not 100% accurate for identifying blockages or heart disease, but it is useful to help us  predict who may have some early changes or be at risk in the future for a heart attack or cardiovascular problem.  The results are reviewed by a Cardiologist and they will document the results. It should become available on MyChart. Typically the results are divided into percentiles based on other patients of the same demographic and age. So it will compare your risk to others similar to you. If you have a higher score >99 or higher percentile >75%tile, it is recommended to consider Statin cholesterol therapy and or referral to Cardiologist. I will try to help explain your results and if we have questions we can contact the Cardiologist.  You will be contacted for scheduling. Usually it is done at any imaging facility through [facility], [facility] or [facility].  The cost is $99 flat fee total and it does not go through insurance, so no authorization is required.     Please schedule a Follow-up Appointment to: Return in about 3 months (around 07/25/2024), or if symptoms worsen or fail to improve, for 3-4 month Diabetic.  If you have any other questions or concerns, please feel free to call the office or send a message through MyChart. You may also schedule an earlier appointment if necessary.  Additionally, you may be receiving a survey about your experience at our office within a few days to 1 week by e-mail or mail. We value your feedback.  Marsa Officer, DO [facility], [place]

## 2024-04-24 NOTE — Progress Notes (Unsigned)
 Subjective:    Patient ID: Shawn Moore, male    DOB: 1961-09-30, 63 y.o.   MRN: [id number]  Shawn Moore is a 63 y.o. male presenting on 04/24/2024 for No chief complaint on file.   HPI  Discussed the use of AI scribe software for clinical note transcription with the patient, who gave verbal consent to proceed.  History of Present Illness    CHRONIC HTN: He is followed by [facility] as well. Current Meds - Benazepril  40mg  daily - OFF Metoprolol  XL 25mg  daily ran out of med. Reports good compliance, took meds today. Tolerating well, w/o complaints. Admits headaches episodic Denies CP, dyspnea, edema, dizziness / lightheadedness   CHRONIC DM, Type 2 A1c elevated now Meds: Metformin  1000mg  TWICE A DAY ***Digestive side effects  - Previously on Rybelsus   Reports good compliance. Tolerating well w/o side-effects Currently on ACEi Fam history Father Type 1 Diabetes. Brother is Type 2 Diabetic. Diet goal to improve ***Last Diabetic Eye Exam [facility] 09/2023 Denies hypoglycemia, polyuria, visual changes, numbness or tingling.   HYPERLIPIDEMIA: Last lipid panel 10/2022 elevated LDL to 100 still within range but off Statin Off Rosuvastatin . No side effect just ran out.   Headache, disorder / Episodic Memory Loss Prior traumatic head injury years ago. Last seen by Dr [facility] 11/2017 He admits episodes are worse in afternoon evening if he feels dehydrated or not as much nutrition can trigger provoked symptoms. Issues with heat, not eating properly, issue with heat and poor hydrated. Says can impact him and cause some confusion or mood changes.     Health Maintenance:   No prior PNA vaccine, wait until age 21   COVID19 Vaccines updated, Pfizer 01/27/20, 02/20/20   Last PSA 0.0.67 03/2021 negative  Last Colonoscopy Dr Viktoria 09/17/11, negative 0 polyps, repeat 10 years Cologuard 11/26/22 negative, repeat 3 years, 11/2025      04/15/2021    1:37 PM 09/16/2020   10:11 AM 09/16/2020    9:46 AM  Depression screen PHQ 2/9  Decreased Interest 0 0 0  Down, Depressed, Hopeless 0 0 0  PHQ - 2 Score 0 0 0  Altered sleeping 0    Tired, decreased energy 1    Change in appetite 0    Feeling bad or failure about yourself  0    Trouble concentrating 0    Moving slowly or fidgety/restless 0    Suicidal thoughts 0    PHQ-9 Score 1    Difficult doing work/chores Not difficult at all         04/15/2021    1:38 PM  GAD 7 : Generalized Anxiety Score  Nervous, Anxious, on Edge 0  Control/stop worrying 1  Worry too much - different things 1  Trouble relaxing 0  Restless 0  Easily annoyed or irritable 1  Afraid - awful might happen 0  Total GAD 7 Score 3  Anxiety Difficulty Somewhat difficult     Past Medical History:  Diagnosis Date   Alcohol abuse    Elevated PSA    Headache    Hyperlipidemia    Hypertension    Injury of back due to fall    Past Surgical History:  Procedure Laterality Date   TONSILLECTOMY     Social History   Socioeconomic History   Marital status: Married    Spouse name: Not on file   Number of children: Not on file   Years of education: Not on file  Highest education level: Not on file  Occupational History   Not on file  Tobacco Use   Smoking status: Never   Smokeless tobacco: Never  Vaping Use   Vaping status: Never Used  Substance and Sexual Activity   Alcohol use: No    Comment: 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   Drug use: No   Sexual activity: Not on file  Other Topics Concern   Not on file  Social History Narrative   Not on file   Social Drivers of Health   Financial Resource Strain: Not on file  Food Insecurity: Not on file  Transportation Needs: Not on file  Physical Activity: Not on file  Stress: Not on file  Social Connections: Not on file  Intimate Partner Violence: Not on file   Family History  Problem Relation Age of Onset   Kidney disease  Father    Diabetes Father    Bladder Cancer Father 11   Healthy Mother    Current Outpatient Medications on File Prior to Visit  Medication Sig   aspirin EC 81 MG tablet Take 81 mg by mouth daily.   metFORMIN  (GLUCOPHAGE ) 500 MG tablet Take 2 tablets (1,000 mg total) by mouth 2 (two) times daily with a meal.   Omega-3 1000 MG CAPS Take by mouth.   albuterol  (VENTOLIN  HFA) 108 (90 Base) MCG/ACT inhaler Inhale 2 puffs into the lungs every 4 (four) hours as needed for wheezing or shortness of breath. (Patient not taking: Reported on 04/24/2024)   No current facility-administered medications on file prior to visit.    Review of Systems Per HPI unless specifically indicated above     Objective:    BP 128/78 (BP Location: Left Arm, Patient Position: Sitting, Cuff Size: Normal)   Pulse 78   Ht 6' 2 (1.88 m)   Wt 217 lb 9.6 oz (98.7 kg)   SpO2 98%   BMI 27.94 kg/m   Wt Readings from Last 3 Encounters:  04/24/24 217 lb 9.6 oz (98.7 kg)  01/12/23 219 lb (99.3 kg)  12/14/22 219 lb 3.2 oz (99.4 kg)    Physical Exam  Diabetic Foot Exam - Simple   No data filed      Results for orders placed or performed in visit on 04/18/24  Lipid panel   Collection Time: 04/19/24  8:35 AM  Result Value Ref Range   Cholesterol 122 <200 mg/dL   HDL 52 > OR = 40 mg/dL   Triglycerides 890 <849 mg/dL   LDL Cholesterol (Calc) 51 mg/dL (calc)   Total CHOL/HDL Ratio 2.3 <5.0 (calc)   Non-HDL Cholesterol (Calc) 70 <869 mg/dL (calc)  Hemoglobin J8r   Collection Time: 04/19/24  8:35 AM  Result Value Ref Range   Hgb A1c MFr Bld 8.5 (H) <5.7 %   Mean Plasma Glucose 197 mg/dL   eAG (mmol/L) 89.0 mmol/L  CBC with Differential/Platelet   Collection Time: 04/19/24  8:35 AM  Result Value Ref Range   WBC 5.3 3.8 - 10.8 Thousand/uL   RBC 4.68 4.20 - 5.80 Million/uL   Hemoglobin 14.3 13.2 - 17.1 g/dL   HCT 55.9 61.4 - 49.9 %   MCV 94.0 80.0 - 100.0 fL   MCH 30.6 27.0 - 33.0 pg   MCHC 32.5 32.0 - 36.0  g/dL   RDW 86.8 88.9 - 84.9 %   Platelets 235 140 - 400 Thousand/uL   MPV 9.4 7.5 - 12.5 fL   Neutro Abs 2,692 1,500 - 7,800 cells/uL  Absolute Lymphocytes 1,760 850 - 3,900 cells/uL   Absolute Monocytes 599 200 - 950 cells/uL   Eosinophils Absolute 217 15 - 500 cells/uL   Basophils Absolute 32 0 - 200 cells/uL   Neutrophils Relative % 50.8 %   Total Lymphocyte 33.2 %   Monocytes Relative 11.3 %   Eosinophils Relative 4.1 %   Basophils Relative 0.6 %  PSA   Collection Time: 04/19/24  8:35 AM  Result Value Ref Range   PSA 0.86 < OR = 4.00 ng/mL  Microalbumin / creatinine urine ratio   Collection Time: 04/19/24  8:35 AM  Result Value Ref Range   Creatinine, Urine 156 20 - 320 mg/dL   Microalb, Ur 1.2 mg/dL   Microalb Creat Ratio 8 <30 mg/g creat  TSH   Collection Time: 04/19/24  8:35 AM  Result Value Ref Range   TSH 1.88 0.40 - 4.50 mIU/L  Comprehensive metabolic panel with GFR   Collection Time: 04/19/24  8:35 AM  Result Value Ref Range   Glucose, Bld 187 (H) 65 - 99 mg/dL   BUN 16 7 - 25 mg/dL   Creat 9.04 9.29 - 8.64 mg/dL   eGFR 89 > OR = 60 fO/fpw/8.26f7   BUN/Creatinine Ratio SEE NOTE: 6 - 22 (calc)   Sodium 137 135 - 146 mmol/L   Potassium 4.4 3.5 - 5.3 mmol/L   Chloride 103 98 - 110 mmol/L   CO2 28 20 - 32 mmol/L   Calcium  9.5 8.6 - 10.3 mg/dL   Total Protein 6.6 6.1 - 8.1 g/dL   Albumin 4.5 3.6 - 5.1 g/dL   Globulin 2.1 1.9 - 3.7 g/dL (calc)   AG Ratio 2.1 1.0 - 2.5 (calc)   Total Bilirubin 0.5 0.2 - 1.2 mg/dL   Alkaline phosphatase (APISO) 100 35 - 144 U/L   AST 13 10 - 35 U/L   ALT 15 9 - 46 U/L      Assessment & Plan:   Problem List Items Addressed This Visit     Essential hypertension   Relevant Medications   benazepril  (LOTENSIN ) 40 MG tablet   metoprolol  succinate (TOPROL -XL) 25 MG 24 hr tablet   rosuvastatin  (CRESTOR ) 5 MG tablet   Other Relevant Orders   CT CARDIAC SCORING (SELF PAY ONLY)   Hyperlipidemia associated with type 2 diabetes  mellitus (HCC)   Relevant Medications   benazepril  (LOTENSIN ) 40 MG tablet   metoprolol  succinate (TOPROL -XL) 25 MG 24 hr tablet   rosuvastatin  (CRESTOR ) 5 MG tablet   Other Relevant Orders   CT CARDIAC SCORING (SELF PAY ONLY)   Type 2 diabetes mellitus with other specified complication (HCC) - Primary   Relevant Medications   benazepril  (LOTENSIN ) 40 MG tablet   rosuvastatin  (CRESTOR ) 5 MG tablet   Other Relevant Orders   CT CARDIAC SCORING (SELF PAY ONLY)   Other Visit Diagnoses       Need for Streptococcus pneumoniae vaccination       Relevant Orders   Pneumococcal conjugate vaccine 20-valent (Completed)        Updated Health Maintenance information ***- Reviewed recent lab results with patient Encouraged improvement to lifestyle with diet and exercise -*** Goal of weight loss  Assessment and Plan Assessment & Plan      Orders Placed This Encounter  Procedures   CT CARDIAC SCORING (SELF PAY ONLY)    Standing Status:   Future    Expiration Date:   04/24/2025    Preferred imaging location?:  [facility]   Pneumococcal conjugate vaccine 20-valent    Meds ordered this encounter  Medications   benazepril  (LOTENSIN ) 40 MG tablet    Sig: Take 1 tablet (40 mg total) by mouth daily.    Dispense:  90 tablet    Refill:  3    Add FUTURE refills   metoprolol  succinate (TOPROL -XL) 25 MG 24 hr tablet    Sig: Take 1 tablet (25 mg total) by mouth daily.    Dispense:  90 tablet    Refill:  3    Add FUTURE refills   rosuvastatin  (CRESTOR ) 5 MG tablet    Sig: Take 1 tablet (5 mg total) by mouth daily.    Dispense:  90 tablet    Refill:  3    Add FUTURE refills     Follow up plan: Return in about 3 months (around 07/25/2024), or if symptoms worsen or fail to improve, for 3-4 month Diabetic.  Marsa Officer, DO [facility] 04/24/2024, 11:33 AM

## 2024-04-25 ENCOUNTER — Encounter: Payer: Self-pay | Admitting: Family Medicine

## 2024-05-12 ENCOUNTER — Ambulatory Visit
Admission: RE | Admit: 2024-05-12 | Discharge: 2024-05-12 | Disposition: A | Discharge status: Home / Self Care | Payer: Self-pay | Source: Ambulatory Visit | Attending: Family Medicine | Admitting: Family Medicine

## 2024-05-12 DIAGNOSIS — I10 Essential (primary) hypertension: Secondary | ICD-10-CM | POA: Insufficient documentation

## 2024-05-12 DIAGNOSIS — E785 Hyperlipidemia, unspecified: Secondary | ICD-10-CM | POA: Insufficient documentation

## 2024-05-12 DIAGNOSIS — E1169 Type 2 diabetes mellitus with other specified complication: Secondary | ICD-10-CM | POA: Insufficient documentation

## 2024-05-18 ENCOUNTER — Other Ambulatory Visit: Payer: Self-pay | Admitting: Family Medicine

## 2024-05-18 ENCOUNTER — Ambulatory Visit: Payer: Self-pay | Admitting: Family Medicine

## 2024-05-18 DIAGNOSIS — R918 Other nonspecific abnormal finding of lung field: Secondary | ICD-10-CM

## 2024-05-25 ENCOUNTER — Ambulatory Visit

## 2024-06-01 ENCOUNTER — Ambulatory Visit
Admission: RE | Admit: 2024-06-01 | Discharge: 2024-06-01 | Disposition: A | Discharge status: Home / Self Care | Source: Ambulatory Visit | Attending: Family Medicine | Admitting: Family Medicine

## 2024-06-01 DIAGNOSIS — R918 Other nonspecific abnormal finding of lung field: Secondary | ICD-10-CM | POA: Insufficient documentation

## 2024-06-01 DIAGNOSIS — R911 Solitary pulmonary nodule: Secondary | ICD-10-CM | POA: Diagnosis not present

## 2024-06-02 ENCOUNTER — Encounter: Payer: Self-pay | Admitting: Family Medicine

## 2024-06-07 ENCOUNTER — Other Ambulatory Visit: Payer: Self-pay | Admitting: Medical Genetics

## 2024-06-16 ENCOUNTER — Other Ambulatory Visit

## 2024-06-16 ENCOUNTER — Encounter: Payer: Self-pay | Admitting: Family Medicine

## 2024-06-16 ENCOUNTER — Other Ambulatory Visit: Payer: Self-pay | Admitting: Medical Genetics

## 2024-06-16 ENCOUNTER — Ambulatory Visit: Payer: Self-pay | Admitting: Family Medicine

## 2024-06-16 DIAGNOSIS — E1169 Type 2 diabetes mellitus with other specified complication: Secondary | ICD-10-CM

## 2024-06-16 DIAGNOSIS — Z006 Encounter for examination for normal comparison and control in clinical research program: Secondary | ICD-10-CM

## 2024-06-26 MED ORDER — RYBELSUS 7 MG PO TABS: 7.0000 mg | ORAL_TABLET | Freq: Every day | ORAL | 1 refills | Status: AC

## 2024-06-30 LAB — GENECONNECT MOLECULAR SCREEN: Genetic Analysis Overall Interpretation: NEGATIVE

## 2024-10-03 ENCOUNTER — Encounter: Payer: Self-pay | Admitting: Family Medicine

## 2024-10-03 ENCOUNTER — Ambulatory Visit: Admitting: Family Medicine

## 2024-10-03 VITALS — BP 144/82 | HR 80 | Ht 74.0 in | Wt 220.5 lb

## 2024-10-03 DIAGNOSIS — L309 Dermatitis, unspecified: Secondary | ICD-10-CM

## 2024-10-03 MED ORDER — TRIAMCINOLONE ACETONIDE 0.1 % EX CREA: 1.0000 | TOPICAL_CREAM | Freq: Two times a day (BID) | CUTANEOUS | 0 refills | Status: AC

## 2024-10-03 MED ORDER — AMOXICILLIN-POT CLAVULANATE 875-125 MG PO TABS: 1.0000 | ORAL_TABLET | Freq: Two times a day (BID) | ORAL | 0 refills | Status: AC

## 2024-10-03 NOTE — Patient Instructions (Addendum)
 Thank you for coming to the office today.  If it does appear infected or worsening spreading redness, pain fever etc then take the Augmentin  antibiotic otherwise skip that one.  Main task avoid rubbing and scratching.  The rash looks most consistent with eczema or DERMATITIS, this can flare up and get worse due to a variety of factors (excessive dry skin from bathing/showering, soaps, cold weather / indoor heaters, outdoor exposures).  Use the topical steroid creams twice a day for up to 1 week, maximum duration of use per one flare is 10 to 14 days, then STOP using it and allow skin to recover. Caution with over-use may cause lightening of the skin.   Tips to reduce Eczema Flares: For baths/showers, limit bathing to every other day if you can (max 1 x daily)  Use a gentle, unscented soap and lukewarm water (hot water is most irritating to skin) Never scrub skin with too much pressure, this causes more irritation. Pat skin dry, then leave it slightly damp. DO NOT scrub it dry. Apply steroid cream to skin and rub in all the way, wait 15 min, then apply a daily moisturizer (Vaseline, Eucerin, Aveeno). Continue daily moisturizer every day of the year (even after flare is resolved) - If you have eczema on hands or dry hands, recommend wearing any type of gloves overnight (cloth, fabric, or even nitrile/latex) to improve effect of topical moisturizer  If develops redness, honey colored crust oozing, drainage of pus, bleeding, or redness / swelling, pain, please return for re-evaluation, may have become infected after scratching.   Please schedule a Follow-up Appointment to: Return in about 3 months (around 01/01/2025) for 3 month DM A1c.  If you have any other questions or concerns, please feel free to call the office or send a message through MyChart. You may also schedule an earlier appointment if necessary.  Additionally, you may be receiving a survey about your experience at our office within  a few days to 1 week by e-mail or mail. We value your feedback.  Marsa Officer, DO [facility], [place]

## 2024-10-03 NOTE — Progress Notes (Signed)
 Subjective:    Patient ID: Shawn Moore, male    DOB: Feb 12, 1961, 63 y.o.   MRN: [id number]  KIER SMEAD is a 63 y.o. male presenting on 10/03/2024 for Eye Problem (Left eye redness )  Patient presents for a same day appointment.   HPI  Discussed the use of AI scribe software for clinical note transcription with the patient, who gave verbal consent to proceed.  History of Present Illness   Jawaan A Jacuinde is a 63 year old male who presents with periorbital dermatitis.  Periorbital dermatitis - Redness and inflammation localized around the left eye, onset Friday after returning from travel to [place]  and [place] - Progressive enlargement and increased inflammation since onset - Area is itchy, warm to the touch, and feels tight - No pain, visual disturbances, or direct involvement of the eye - No fever, chills, or systemic symptoms - No similar symptoms in household contacts or recent exposures - No history of similar episodes except for possible pink eye once in the past - Suspected trigger includes contact with irritants during travel, such as hotel pillowcases or surfaces  Self-treatment attempts - Applied hot and cold compresses and triple antibiotic ointment over the weekend - No improvement in symptoms with self-treatment  Hypertension management - Currently taking benazepril  at night and metoprolol  in the morning - Blood pressure today measured at 160/88 mmHg          04/15/2021    1:37 PM 09/16/2020   10:11 AM 09/16/2020    9:46 AM  Depression screen PHQ 2/9  Decreased Interest 0 0 0  Down, Depressed, Hopeless 0 0 0  PHQ - 2 Score 0 0 0  Altered sleeping 0    Tired, decreased energy 1    Change in appetite 0    Feeling bad or failure about yourself  0    Trouble concentrating 0    Moving slowly or fidgety/restless 0    Suicidal thoughts 0    PHQ-9 Score 1     Difficult doing work/chores Not difficult at all       Data saved with a previous  flowsheet row definition       04/15/2021    1:38 PM  GAD 7 : Generalized Anxiety Score  Nervous, Anxious, on Edge 0  Control/stop worrying 1  Worry too much - different things 1  Trouble relaxing 0  Restless 0  Easily annoyed or irritable 1  Afraid - awful might happen 0  Total GAD 7 Score 3  Anxiety Difficulty Somewhat difficult    Social History[1]  Review of Systems Per HPI unless specifically indicated above     Objective:    BP (!) 144/82 (BP Location: Left Arm, Cuff Size: Normal)   Pulse 80   Ht 6' 2 (1.88 m)   Wt 220 lb 8 oz (100 kg)   SpO2 96%   BMI 28.31 kg/m   Wt Readings from Last 3 Encounters:  10/03/24 220 lb 8 oz (100 kg)  04/24/24 217 lb 9.6 oz (98.7 kg)  01/12/23 219 lb (99.3 kg)    Physical Exam Vitals and nursing note reviewed.  Constitutional:      General: He is not in acute distress.    Appearance: Normal appearance. He is well-developed. He is not diaphoretic.     Comments: Well-appearing, comfortable, cooperative  HENT:     Head: Normocephalic and atraumatic.  Eyes:     General:  Right eye: No discharge.        Left eye: No discharge.     Extraocular Movements: Extraocular movements intact.     Conjunctiva/sclera: Conjunctivae normal.     Pupils: Pupils are equal, round, and reactive to light.  Cardiovascular:     Rate and Rhythm: Normal rate.  Pulmonary:     Effort: Pulmonary effort is normal.  Skin:    General: Skin is warm and dry.     Findings: Lesion (Left lower periorbital skin with mild erythematous inflamed skin with dry appearance no sign of ulceration or extending erythema. non tender and no focal nodularity mass) present. No erythema or rash.  Neurological:     Mental Status: He is alert and oriented to person, place, and time.  Psychiatric:        Mood and Affect: Mood normal.        Behavior: Behavior normal.        Thought Content: Thought content normal.     Comments: Well groomed, good eye contact, normal  speech and thoughts      Left Inferior periorbital dermatitis   Results for orders placed or performed in visit on 06/16/24  GeneConnect Molecular Screen-Saliva   Collection Time: 06/16/24 11:42 AM  Result Value Ref Range   Genetic Analysis Overall Interpretation Negative    Genetic Disease Assessed      This is a screening test and does not detect all pathogenic or likely pathogenic variant(s) in the tested genes; diagnostic testing is recommended for individuals with a personal or family history of heart disease or hereditary cancer. [facility] Tier One  Population Screen is a screening test that analyzes 11 genes related to hereditary breast and ovarian cancer (HBOC) syndrome, Lynch syndrome, and familial hypercholesterolemia. This test only reports clinically significant pathogenic and likely  pathogenic variants but does not report variants of uncertain significance (VUS). In addition, analysis of the PMS2 gene excludes exons 11-15, which overlap with a known pseudogene (PMS2CL).    Genetic Analysis Report      No pathogenic or likely pathogenic variants were detected in the genes analyzed by this test.Genetic test results should be interpreted in the context of an individual's personal medical and family history. Alteration to medical management is NOT  recommended based solely on this result. Clinical correlation is advised.Additional Considerations- This is a screening test; individuals may still carry pathogenic or likely pathogenic variant(s) in the tested genes that are not detected by this test.-  For individuals at risk for these or other related conditions based on factors including personal or family history, diagnostic testing is recommended.- The absence of pathogenic or likely pathogenic variant(s) in the analyzed genes, while reassuring,  does not eliminate the possibility of a hereditary condition; there are other variants and genes associated with heart disease and hereditary  cancer that are not included in this test.    Genes Tested See Notes    Disclaimer See Notes    Sequencing Location See Notes    Interpretation Methods and Limitations See Notes       Assessment & Plan:   Problem List Items Addressed This Visit   None Visit Diagnoses       Periorbital dermatitis    -  Primary   Relevant Medications   triamcinolone  cream (KENALOG ) 0.1 %   amoxicillin -clavulanate (AUGMENTIN ) 875-125 MG tablet        Periorbital dermatitis Periorbital dermatitis likely due to contact dermatitis or eczema flare. No infection or orbital  cellulitis. Differential includes allergic or irritant contact dermatitis. - Prescribed 0.1% triamcinolone  cream twice daily for two weeks. - Advised daily moisturizer use. - Instructed to avoid hot water, scrubbing, and soap on affected area. - Recommended cool compresses for swelling. - Advised to avoid rubbing or scratching.  BACK UP PLAN ONLY - Prescribed Augmentin  if symptoms worsen or infection signs develop.  Hypertension Elevated blood pressure readings. Managed with benazepril  and metoprolol . Possible stress or environmental influence. Repeat manual BP improved - Continue benazepril  and metoprolol .     Additional complaint R Shoulder pain question from injection in past or shoulder or neuropathy  No orders of the defined types were placed in this encounter.   Meds ordered this encounter  Medications   triamcinolone  cream (KENALOG ) 0.1 %    Sig: Apply 1 Application topically 2 (two) times daily. For up to 1-2 weeks or until affected area is healed.    Dispense:  30 g    Refill:  0   amoxicillin -clavulanate (AUGMENTIN ) 875-125 MG tablet    Sig: Take 1 tablet by mouth 2 (two) times daily.    Dispense:  20 tablet    Refill:  0    Follow up plan: Return in about 3 months (around 01/01/2025) for 3 month DM A1c.   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

## 2024-10-30 ENCOUNTER — Telehealth: Payer: Self-pay

## 2024-10-30 ENCOUNTER — Encounter: Payer: Self-pay | Admitting: Family Medicine

## 2024-10-30 ENCOUNTER — Other Ambulatory Visit (HOSPITAL_COMMUNITY): Payer: Self-pay

## 2024-10-30 NOTE — Telephone Encounter (Signed)
 Pharmacy Patient Advocate Encounter   Received notification from [facility] that prior authorization for Rybelsus  7 is required/requested.   Insurance verification completed.   The patient is insured through [employer].   Per test claim: PA required; PA submitted to above mentioned insurance via Latent Key/confirmation #/EOC [id number] Status is pending

## 2024-11-01 ENCOUNTER — Other Ambulatory Visit (HOSPITAL_COMMUNITY): Payer: Self-pay

## 2024-11-01 NOTE — Telephone Encounter (Signed)
 Pharmacy Patient Advocate Encounter  Received notification from [employer] that Prior Authorization for Rybelsus  7 has been APPROVED from 10/19/24 to 10/30/25. Ran test claim, Copay is $141.00 for a 90 day supply. This test claim was processed through [facility]- copay amounts may vary at other pharmacies due to pharmacy/plan contracts, or as the patient moves through the different stages of their insurance plan.   PA #/Case ID/Reference #: # [id number]

## 2025-01-02 ENCOUNTER — Ambulatory Visit: Admitting: Family Medicine
